# Patient Record
Sex: Male | Born: 1960 | Race: White | Hispanic: No | Marital: Married | Smoking: Former smoker
Health system: Southern US, Community
[De-identification: ages and names within clinical notes are randomized; demographics above are authoritative.]

## PROBLEM LIST (undated history)

## (undated) DIAGNOSIS — E119 Type 2 diabetes mellitus without complications: Secondary | ICD-10-CM

## (undated) DIAGNOSIS — E785 Hyperlipidemia, unspecified: Secondary | ICD-10-CM

## (undated) DIAGNOSIS — I10 Essential (primary) hypertension: Secondary | ICD-10-CM

## (undated) HISTORY — PX: APPENDECTOMY: SHX54

---

## 2007-10-14 DIAGNOSIS — B079 Viral wart, unspecified: Secondary | ICD-10-CM | POA: Insufficient documentation

## 2007-10-14 DIAGNOSIS — G43909 Migraine, unspecified, not intractable, without status migrainosus: Secondary | ICD-10-CM | POA: Insufficient documentation

## 2007-10-14 DIAGNOSIS — L909 Atrophic disorder of skin, unspecified: Secondary | ICD-10-CM | POA: Insufficient documentation

## 2010-12-12 DIAGNOSIS — G609 Hereditary and idiopathic neuropathy, unspecified: Secondary | ICD-10-CM | POA: Insufficient documentation

## 2010-12-12 DIAGNOSIS — G4733 Obstructive sleep apnea (adult) (pediatric): Secondary | ICD-10-CM | POA: Insufficient documentation

## 2010-12-12 DIAGNOSIS — E119 Type 2 diabetes mellitus without complications: Secondary | ICD-10-CM | POA: Insufficient documentation

## 2010-12-12 DIAGNOSIS — I2510 Atherosclerotic heart disease of native coronary artery without angina pectoris: Secondary | ICD-10-CM | POA: Insufficient documentation

## 2010-12-12 DIAGNOSIS — E782 Mixed hyperlipidemia: Secondary | ICD-10-CM | POA: Insufficient documentation

## 2010-12-12 DIAGNOSIS — I10 Essential (primary) hypertension: Secondary | ICD-10-CM | POA: Diagnosis present

## 2011-06-13 DIAGNOSIS — M109 Gout, unspecified: Secondary | ICD-10-CM | POA: Insufficient documentation

## 2013-11-25 DIAGNOSIS — K7460 Unspecified cirrhosis of liver: Secondary | ICD-10-CM | POA: Insufficient documentation

## 2014-01-30 ENCOUNTER — Emergency Department
Admission: EM | Admit: 2014-01-30 | Discharge: 2014-01-30 | Disposition: A | Discharge status: Home / Self Care | Payer: BC Managed Care – PPO | Source: Home / Self Care | Attending: Family Medicine | Admitting: Family Medicine

## 2014-01-30 ENCOUNTER — Encounter: Payer: Self-pay | Admitting: Emergency Medicine

## 2014-01-30 DIAGNOSIS — L03119 Cellulitis of unspecified part of limb: Secondary | ICD-10-CM

## 2014-01-30 DIAGNOSIS — L02419 Cutaneous abscess of limb, unspecified: Secondary | ICD-10-CM

## 2014-01-30 DIAGNOSIS — L03116 Cellulitis of left lower limb: Secondary | ICD-10-CM

## 2014-01-30 HISTORY — DX: Type 2 diabetes mellitus without complications: E11.9

## 2014-01-30 HISTORY — DX: Essential (primary) hypertension: I10

## 2014-01-30 HISTORY — DX: Hyperlipidemia, unspecified: E78.5

## 2014-01-30 MED ORDER — DOXYCYCLINE HYCLATE 100 MG PO CAPS: 100.0000 mg | ORAL_CAPSULE | Freq: Two times a day (BID) | ORAL | Status: DC

## 2014-01-30 NOTE — Discharge Instructions (Signed)
Apply warm compress or hot water bottle several times daily.  While stools are loose, try a BRAT diet (Bananas, Rice, Applesauce, Toast) To decrease loose stools, mix one heaping tablespoon Citrucel (methylcellulose) in 8 oz water and drink one to three times daily.     Cellulitis Cellulitis is an infection of the skin and the tissue beneath it. The infected area is usually red and tender. Cellulitis occurs most often in the arms and lower legs.  CAUSES  Cellulitis is caused by bacteria that enter the skin through cracks or cuts in the skin. The most common types of bacteria that cause cellulitis are Staphylococcus and Streptococcus. SYMPTOMS   Redness and warmth.  Swelling.  Tenderness or pain.  Fever. DIAGNOSIS  Your caregiver can usually determine what is wrong based on a physical exam. Blood tests may also be done. TREATMENT  Treatment usually involves taking an antibiotic medicine. HOME CARE INSTRUCTIONS   Take your antibiotics as directed. Finish them even if you start to feel better.  Keep the infected arm or leg elevated to reduce swelling.  Apply a warm cloth to the affected area up to 4 times per day to relieve pain.  Only take over-the-counter or prescription medicines for pain, discomfort, or fever as directed by your caregiver.  Keep all follow-up appointments as directed by your caregiver. SEEK MEDICAL CARE IF:   You notice red streaks coming from the infected area.  Your red area gets larger or turns dark in color.  Your bone or joint underneath the infected area becomes painful after the skin has healed.  Your infection returns in the same area or another area.  You notice a swollen bump in the infected area.  You develop new symptoms. SEEK IMMEDIATE MEDICAL CARE IF:   You have a fever.  You feel very sleepy.  You develop vomiting or diarrhea.  You have a general ill feeling (malaise) with muscle aches and pains. MAKE SURE YOU:   Understand  these instructions.  Will watch your condition.  Will get help right away if you are not doing well or get worse. Document Released: 07/02/2005 Document Revised: 03/23/2012 Document Reviewed: 12/08/2011 [facility]ExitCare Patient Information 2014 [place].

## 2014-01-30 NOTE — ED Notes (Signed)
Pt c/o possible insect bite to his LT upper thigh x 2 days. He also c/o diarrhea with green stool x 2 days. Denies fever.

## 2014-01-30 NOTE — ED Provider Notes (Signed)
CSN: [id number]     Arrival date & time 01/30/14  [id number] History   First MD Initiated Contact with Patient 01/30/14 1913     Chief Complaint  Patient presents with  . Insect Bite  . Abscess      HPI Comments: Patient noticed a bump on his left upper thigh two days ago that he thought could be an insect bite.  The area has become more tender and red.  He denies fevers, chills, and sweats and feels well otherwise but developed diarrhea last night.  No abdominal pain.  No nausea/vomiting.  Patient is a 53 y.o. male presenting with rash. The history is provided by the patient.  Rash Pain location: left upper thigh. Pain quality: aching   Pain severity:  Mild Onset quality:  Gradual Duration:  2 days Timing:  Constant Progression:  Worsening Chronicity:  New Relieved by:  Nothing Ineffective treatments:  None tried Associated symptoms: diarrhea   Associated symptoms: no anorexia, no chest pain, no chills, no fatigue, no fever, no hematochezia, no nausea, no sore throat and no vomiting     Past Medical History  Diagnosis Date  . Diabetes mellitus without complication   . Hypertension   . Hyperlipidemia    Past Surgical History  Procedure Laterality Date  . Appendectomy     Family History  Problem Relation Age of Onset  . Diabetes Mother   . Heart failure Mother   . Heart failure Father    History  Substance Use Topics  . Smoking status: Former Games developermoker  . Smokeless tobacco: Not on file  . Alcohol Use: No    Review of Systems  Constitutional: Negative for fever, chills and fatigue.  HENT: Negative for sore throat.   Cardiovascular: Negative for chest pain.  Gastrointestinal: Positive for diarrhea. Negative for nausea, vomiting, hematochezia and anorexia.  Skin: Positive for rash.  All other systems reviewed and are negative.   Allergies  Review of patient's allergies indicates no known allergies.  Home Medications   Prior to Admission medications   Medication Sig  Start Date End Date Taking? Authorizing Provider  allopurinol (ZYLOPRIM) 100 MG tablet Take 100 mg by mouth daily.   Yes Historical Provider, MD  amitriptyline (ELAVIL) 150 MG tablet Take 150 mg by mouth at bedtime.   Yes Historical Provider, MD  atenolol (TENORMIN) 100 MG tablet Take 100 mg by mouth daily.   Yes Historical Provider, MD  citalopram (CELEXA) 40 MG tablet Take 40 mg by mouth daily.   Yes Historical Provider, MD  clonazePAM (KLONOPIN) 0.5 MG tablet Take 0.5 mg by mouth 2 (two) times daily as needed for anxiety.   Yes Historical Provider, MD  glimepiride (AMARYL) 2 MG tablet Take 2 mg by mouth daily with breakfast.   Yes Historical Provider, MD  metFORMIN (GLUCOPHAGE) 1000 MG tablet Take 1,000 mg by mouth 2 (two) times daily with a meal.   Yes Historical Provider, MD  pravastatin (PRAVACHOL) 40 MG tablet Take 40 mg by mouth daily.   Yes Historical Provider, MD   BP 166/87  Pulse 74  Temp(Src) 98.1 F (36.7 C) (Oral)  Resp 18  Ht 6' (1.829 m)  Wt 268 lb (121.564 kg)  BMI 36.34 kg/m2  SpO2 98% Physical Exam  Nursing note and vitals reviewed. Constitutional: He is oriented to person, place, and time. He appears well-developed and well-nourished. No distress.  Patient is obese (BMI 36.3)  HENT:  Head: Normocephalic.  Mouth/Throat: Oropharynx is clear and moist.  Eyes: Conjunctivae are normal. Pupils are equal, round, and reactive to light.  Neck: Neck supple.  Cardiovascular: Normal heart sounds.   Pulmonary/Chest: Breath sounds normal.  Abdominal: Bowel sounds are normal. There is no tenderness.  Lymphadenopathy:    He has no cervical adenopathy.  Neurological: He is alert and oriented to person, place, and time.  Skin: Skin is warm and dry. Rash noted. Rash is macular.     On patient's left upper medial thigh is an area of macular erythema 5cm by 9cm  as noted on diagram.  No swelling or induration; area is mildly tender to palpation     ED Course  Procedures   none          MDM   1. Cellulitis of left thigh    Begin doxycycline. Apply warm compress or hot water bottle several times daily.  While stools are loose, try a BRAT diet (Bananas, Rice, Applesauce, Toast) To decrease loose stools, mix one heaping tablespoon Citrucel (methylcellulose) in 8 oz water and drink one to three times daily. Followup with Family Doctor if not improved in about 3 days.    Lattie HawStephen A Beese, MD 02/07/14 [phone]

## 2014-06-07 ENCOUNTER — Emergency Department
Admission: EM | Admit: 2014-06-07 | Discharge: 2014-06-07 | Disposition: A | Discharge status: Home / Self Care | Payer: BC Managed Care – PPO | Source: Home / Self Care | Attending: Family Medicine | Admitting: Family Medicine

## 2014-06-07 ENCOUNTER — Encounter: Payer: Self-pay | Admitting: Emergency Medicine

## 2014-06-07 DIAGNOSIS — L03119 Cellulitis of unspecified part of limb: Secondary | ICD-10-CM

## 2014-06-07 DIAGNOSIS — L02419 Cutaneous abscess of limb, unspecified: Secondary | ICD-10-CM

## 2014-06-07 DIAGNOSIS — L03116 Cellulitis of left lower limb: Secondary | ICD-10-CM

## 2014-06-07 MED ORDER — MUPIROCIN 2 % EX OINT: 1.0000 "application " | TOPICAL_OINTMENT | Freq: Three times a day (TID) | CUTANEOUS | Status: DC

## 2014-06-07 MED ORDER — DOXYCYCLINE HYCLATE 100 MG PO CAPS: 100.0000 mg | ORAL_CAPSULE | Freq: Two times a day (BID) | ORAL | Status: DC

## 2014-06-07 NOTE — Discharge Instructions (Signed)
Change bandage and apply Mupirocin ointment three times daily.  Obtain below knee support hose (mild compression 8-15mm Hg) and wear daytime.  Elevate whenever possible.   Cellulitis Cellulitis is an infection of the skin and the tissue beneath it. The infected area is usually red and tender. Cellulitis occurs most often in the arms and lower legs.  CAUSES  Cellulitis is caused by bacteria that enter the skin through cracks or cuts in the skin. The most common types of bacteria that cause cellulitis are staphylococci and streptococci. SIGNS AND SYMPTOMS   Redness and warmth.  Swelling.  Tenderness or pain.  Fever. DIAGNOSIS  Your health care provider can usually determine what is wrong based on a physical exam. Blood tests may also be done. TREATMENT  Treatment usually involves taking an antibiotic medicine. HOME CARE INSTRUCTIONS   Take your antibiotic medicine as directed by your health care provider. Finish the antibiotic even if you start to feel better.  Keep the infected arm or leg elevated to reduce swelling.  Apply a warm cloth to the affected area up to 4 times per day to relieve pain.  Take medicines only as directed by your health care provider.  Keep all follow-up visits as directed by your health care provider. SEEK MEDICAL CARE IF:   You notice red streaks coming from the infected area.  Your red area gets larger or turns dark in color.  Your bone or joint underneath the infected area becomes painful after the skin has healed.  Your infection returns in the same area or another area.  You notice a swollen bump in the infected area.  You develop new symptoms.  You have a fever. SEEK IMMEDIATE MEDICAL CARE IF:   You feel very sleepy.  You develop vomiting or diarrhea.  You have a general ill feeling (malaise) with muscle aches and pains. MAKE SURE YOU:   Understand these instructions.  Will watch your condition.  Will get help right away if you  are not doing well or get worse. Document Released: 07/02/2005 Document Revised: 02/06/2014 Document Reviewed: 12/08/2011 [facility] Patient Information 2015 [place], [place]. This information is not intended to replace advice given to you by your health care provider. Make sure you discuss any questions you have with your health care provider.

## 2014-06-07 NOTE — ED Notes (Signed)
Large landscaping stone fell on left distal lower leg x 1 week ago.  Wound to medial aspect of lower leg with scabbing and redness surrounding wound.  No drainage seen.

## 2014-06-07 NOTE — ED Notes (Signed)
Wound assessment for Left lower leg. Scabbed area: Length: 1 cm.  Width: 3 cm.   Area surrounding scabbed area: 1 cm redness. No swelling. No drainage. Pedal pulse strong.

## 2014-06-07 NOTE — ED Provider Notes (Signed)
CSN: [id number]     Arrival date & time 06/07/14  1803 History   First MD Initiated Contact with Patient 06/07/14 1849     Chief Complaint  Patient presents with  . Wound Infection      HPI Comments: Patient was working with landscaping retaining wall blocks one week ago.  One block fell and scraped his left lower leg.  The abrasion seemed to be healing until 3 days ago when it became painful and developed surrounding redness.  There has been no drainage.  No fevers, chills, and sweats   Patient is a 53 y.o. male presenting with rash. The history is provided by the patient.  Rash Pain location: left lower leg. Pain quality: aching   Pain severity:  No pain Onset quality:  Gradual Duration:  1 week Timing:  Constant Progression:  Worsening Chronicity:  New Context: recent illness   Relieved by:  None tried Worsened by:  Nothing tried Ineffective treatments:  None tried Associated symptoms: no chills, no fatigue, no fever and no nausea     Past Medical History  Diagnosis Date  . Diabetes mellitus without complication   . Hypertension   . Hyperlipidemia    Past Surgical History  Procedure Laterality Date  . Appendectomy     Family History  Problem Relation Age of Onset  . Diabetes Mother   . Heart failure Mother   . Heart failure Father    History  Substance Use Topics  . Smoking status: Former Games developer  . Smokeless tobacco: Not on file  . Alcohol Use: No    Review of Systems  Constitutional: Negative for fever, chills and fatigue.  Gastrointestinal: Negative for nausea.  Skin: Positive for rash.  All other systems reviewed and are negative.   Allergies  Review of patient's allergies indicates no known allergies.  Home Medications   Prior to Admission medications   Medication Sig Start Date End Date Taking? Authorizing Provider  allopurinol (ZYLOPRIM) 100 MG tablet Take 100 mg by mouth daily.    Historical Provider, MD  amitriptyline (ELAVIL) 150 MG tablet  Take 150 mg by mouth at bedtime.    Historical Provider, MD  atenolol (TENORMIN) 100 MG tablet Take 100 mg by mouth daily.    Historical Provider, MD  citalopram (CELEXA) 40 MG tablet Take 40 mg by mouth daily.    Historical Provider, MD  clonazePAM (KLONOPIN) 0.5 MG tablet Take 0.5 mg by mouth 2 (two) times daily as needed for anxiety.    Historical Provider, MD  doxycycline (VIBRAMYCIN) 100 MG capsule Take 1 capsule (100 mg total) by mouth 2 (two) times daily. 06/07/14   Lattie Haw, MD  glimepiride (AMARYL) 2 MG tablet Take 2 mg by mouth daily with breakfast.    Historical Provider, MD  metFORMIN (GLUCOPHAGE) 1000 MG tablet Take 1,000 mg by mouth 2 (two) times daily with a meal.    Historical Provider, MD  mupirocin ointment (BACTROBAN) 2 % Apply 1 application topically 3 (three) times daily. 06/07/14   Lattie Haw, MD  pravastatin (PRAVACHOL) 40 MG tablet Take 40 mg by mouth daily.    Historical Provider, MD   BP 128/76  Pulse 69  Temp(Src) 98.6 F (37 C) (Oral)  Resp 16  Ht 6' (1.829 m)  Wt 251 lb (113.853 kg)  BMI 34.03 kg/m2  SpO2 99% Physical Exam  Nursing note and vitals reviewed. Constitutional: He is oriented to person, place, and time. He appears well-developed and well-nourished. No  distress.  Patient is obese (BMI 34.0)  HENT:  Head: Normocephalic.  Eyes: Conjunctivae are normal. Pupils are equal, round, and reactive to light.  Cardiovascular: Normal heart sounds.   Pulmonary/Chest: Breath sounds normal.  Musculoskeletal:       Left lower leg: He exhibits tenderness and edema. He exhibits no bony tenderness, no swelling and no laceration.       Legs: There is an abrasion left lower leg pre-tibial area above ankle approximately 1.5cm by 3.0cm with dry eschar present.  There is surrounding erythema as noted on diagram, with tenderness to palpation.  No induration or fluctuance.  There is trace edema of the lower leg.    Lymphadenopathy:    He has no cervical  adenopathy.  Neurological: He is alert and oriented to person, place, and time.  Skin: Skin is warm and dry.      ED Course  Procedures  none   MDM   1. Cellulitis of left lower leg; suspect staph    Begin doxycycline, and Bactroban ointment. Change bandage and apply Mupirocin ointment three times daily.  Obtain below knee support hose (mild compression 8-15mm Hg) and wear daytime.  Elevate whenever possible. Followup with Family Doctor if not improved in about 5 days.    Lattie Haw, MD 06/11/14 [phone]

## 2014-09-07 ENCOUNTER — Emergency Department
Admission: EM | Admit: 2014-09-07 | Discharge: 2014-09-07 | Disposition: A | Discharge status: Home / Self Care | Payer: BC Managed Care – PPO | Source: Home / Self Care | Attending: Emergency Medicine | Admitting: Emergency Medicine

## 2014-09-07 ENCOUNTER — Emergency Department (INDEPENDENT_AMBULATORY_CARE_PROVIDER_SITE_OTHER): Payer: BC Managed Care – PPO

## 2014-09-07 ENCOUNTER — Encounter: Payer: Self-pay | Admitting: *Deleted

## 2014-09-07 DIAGNOSIS — M25552 Pain in left hip: Secondary | ICD-10-CM

## 2014-09-07 DIAGNOSIS — R52 Pain, unspecified: Secondary | ICD-10-CM

## 2014-09-07 DIAGNOSIS — M47897 Other spondylosis, lumbosacral region: Secondary | ICD-10-CM

## 2014-09-07 DIAGNOSIS — M545 Low back pain, unspecified: Secondary | ICD-10-CM

## 2014-09-07 DIAGNOSIS — M5442 Lumbago with sciatica, left side: Secondary | ICD-10-CM

## 2014-09-07 MED ORDER — METHYLPREDNISOLONE ACETATE 40 MG/ML IJ SUSP
80.0000 mg | Freq: Once | INTRAMUSCULAR | Status: AC
  Administered 2014-09-07: 80 mg via INTRAMUSCULAR

## 2014-09-07 MED ORDER — ACETAMINOPHEN 325 MG PO TABS
650.0000 mg | ORAL_TABLET | Freq: Once | ORAL | Status: AC
  Administered 2014-09-07: 650 mg via ORAL

## 2014-09-07 MED ORDER — HYDROCODONE-ACETAMINOPHEN 5-325 MG PO TABS: 1.0000 | ORAL_TABLET | ORAL | Status: DC | PRN

## 2014-09-07 MED ORDER — PREDNISONE (PAK) 10 MG PO TABS: ORAL_TABLET | ORAL | Status: DC

## 2014-09-07 NOTE — ED Notes (Signed)
Left hip pain started after picking up truck door 3 days ago. Pain radiates to left foot.

## 2014-09-07 NOTE — ED Provider Notes (Signed)
CSN: [id number]     Arrival date & time 09/07/14  1803 History   First MD Initiated Contact with Patient 09/07/14 1810     Chief Complaint  Patient presents with  . Hip Pain    Patient is a 53 y.o. male presenting with back pain. The history is provided by the patient.  Back Pain Location:  Lumbar spine (lumbar spine and left hip) Quality:  Shooting Radiates to:  L foot Pain severity:  Severe Pain is:  Unable to specify Onset quality:  Gradual Duration:  3 days Timing:  Constant Progression:  Worsening Chronicity:  New Context comment:  Started after he did some lifting three days ago Worsened by:  Bending, movement and twisting Ineffective treatments:  Bed rest Associated symptoms: leg pain and numbness   Associated symptoms: no abdominal pain, no bladder incontinence, no bowel incontinence, no chest pain, no dysuria, no fever, no perianal numbness and no weakness   Risk factors comment:  Lumbar surgery many years ago and [place]  History of lumbar discectomy many years ago in [place]. After moving to [place], many years ago, he had a flareup of lumbar pain, he states treated medically in 2003 by Dr. Shella Spearing, and the pain resolved.  Past Medical History  Diagnosis Date  . Diabetes mellitus without complication   . Hypertension   . Hyperlipidemia    Past Surgical History  Procedure Laterality Date  . Appendectomy     Family History  Problem Relation Age of Onset  . Diabetes Mother   . Heart failure Mother   . Heart failure Father    History  Substance Use Topics  . Smoking status: Former Games developer  . Smokeless tobacco: Not on file  . Alcohol Use: No    Review of Systems  Constitutional: Negative for fever.  Cardiovascular: Negative for chest pain.  Gastrointestinal: Negative for abdominal pain and bowel incontinence.  Genitourinary: Negative for bladder incontinence and dysuria.  Musculoskeletal: Positive for back pain.       Positive for left hip pain    Neurological: Positive for numbness. Negative for weakness.  All other systems reviewed and are negative.   Allergies  Review of patient's allergies indicates no known allergies.  Home Medications   Prior to Admission medications   Medication Sig Start Date End Date Taking? Authorizing Provider  allopurinol (ZYLOPRIM) 100 MG tablet Take 100 mg by mouth daily.    Historical Provider, MD  amitriptyline (ELAVIL) 150 MG tablet Take 150 mg by mouth at bedtime.    Historical Provider, MD  atenolol (TENORMIN) 100 MG tablet Take 100 mg by mouth daily.    Historical Provider, MD  citalopram (CELEXA) 40 MG tablet Take 40 mg by mouth daily.    Historical Provider, MD  clonazePAM (KLONOPIN) 0.5 MG tablet Take 0.5 mg by mouth 2 (two) times daily as needed for anxiety.    Historical Provider, MD  glimepiride (AMARYL) 2 MG tablet Take 2 mg by mouth daily with breakfast.    Historical Provider, MD  HYDROcodone-acetaminophen (NORCO/VICODIN) 5-325 MG per tablet Take 1-2 tablets by mouth every 4 (four) hours as needed for severe pain. Take with food. 09/07/14   Lajean Manes, MD  metFORMIN (GLUCOPHAGE) 1000 MG tablet Take 1,000 mg by mouth 2 (two) times daily with a meal.    Historical Provider, MD  mupirocin ointment (BACTROBAN) 2 % Apply 1 application topically 3 (three) times daily. 06/07/14   Lattie Haw, MD  pravastatin (PRAVACHOL) 40 MG tablet  Take 40 mg by mouth daily.    Historical Provider, MD  predniSONE (STERAPRED UNI-PAK) 10 MG tablet Start taking Friday December 4th.  Take as directed for 6 days. 09/07/14   Lajean Manesavid Massey, MD   BP 161/93 mmHg  Pulse 73  Temp(Src) 98.2 F (36.8 C) (Oral)  Resp 16  Wt 264 lb (119.75 kg)  SpO2 100% Physical Exam  Constitutional: He is oriented to person, place, and time. He appears well-developed and well-nourished. No distress.  Very uncomfortable from lumbar and left hip pain. He splints himself to avoid movement, which exacerbates the pain.  HENT:  Head:  Normocephalic and atraumatic.  Eyes: Conjunctivae and EOM are normal. Pupils are equal, round, and reactive to light. No scleral icterus.  Neck: Normal range of motion.  Cardiovascular: Normal rate.   Pulmonary/Chest: Effort normal.  Abdominal: He exhibits no distension.  Musculoskeletal:       Lumbar back: He exhibits decreased range of motion, tenderness and bony tenderness. He exhibits no laceration and normal pulse.       Back:  Diffuse and point tenderness lumbar spine, left SI joint, left greater trochanter, and left sciatic notch.  Left straight leg raise test positive.  Motor and sensory lower extremities equally and intact. DTRs 1+ and equal lower extremities bilaterally  Neurological: He is alert and oriented to person, place, and time.  Skin: Skin is warm. No rash noted.  Psychiatric: He has a normal mood and affect.  Nursing note and vitals reviewed.   ED Course  Procedures (including critical care time) Labs Review Labs Reviewed - No data to display  Imaging Review Dg Lumbar Spine Complete  09/07/2014   CLINICAL DATA:  Low back pain radiating down left leg. Back injury while lifting up a car door. Initial encounter.  EXAM: LUMBAR SPINE - COMPLETE 4+ VIEW  COMPARISON:  None.  FINDINGS: There is no evidence of lumbar spine fracture. Alignment is normal. Intervertebral disc spaces are maintained.  Lumbar vertebral osteophyte formation seen without significant disc space narrowing. Mild to moderate degenerative disc disease seen bilaterally at L4-5 and L5-S1.  IMPRESSION: No acute findings.  Degenerative spondylosis, as described above.   Electronically Signed   By: Myles RosenthalJohn  Stahl M.D.   On: 09/07/2014 19:05   Dg Hip Complete Left  09/07/2014   CLINICAL DATA:  Left hip and back pain for 4 days.  Initial visit.  EXAM: LEFT HIP - COMPLETE 2+ VIEW  COMPARISON:  None.  FINDINGS: No acute fracture. No dislocation. Mild degenerative changes in the hip joints and lower lumbar spine.   IMPRESSION: No acute bony pathology.   Electronically Signed   By: Maryclare BeanArt  Hoss M.D.   On: 09/07/2014 19:05     MDM   1. Left-sided low back pain with left-sided sciatica   2. Pain   3. Hip pain, acute, left   4. Acute lumbar back pain    Treatment options discussed, as well as risks, benefits, alternatives. Patient voiced understanding and agreement with the following plans: Depo-Medrol 80 mgIM stat. He states he thinks he might be intolerant or allergic to Toradol, so were avoiding NSAIDs. He lists cyclobenzaprine as an allergy, so I'm avoiding muscle relaxant. Discharge Medication List as of 09/07/2014  8:12 PM    START taking these medications   Details  HYDROcodone-acetaminophen (NORCO/VICODIN) 5-325 MG per tablet Take 1-2 tablets by mouth every 4 (four) hours as needed for severe pain. Take with food., Starting 09/07/2014, Until Discontinued, Print  predniSONE (STERAPRED UNI-PAK) 10 MG tablet Start taking Friday December 4th.  Take as directed for 6 days., Print       Follow-up with ortho or neurosurgeon in 1-2 days, or ER sooner if red flag/symptoms become worse. Precautions discussed. Red flags discussed. Questions invited and answered. Patient voiced understanding and agreement.     Lajean Manesavid Massey, MD 09/09/14 1055

## 2015-03-16 DIAGNOSIS — G2581 Restless legs syndrome: Secondary | ICD-10-CM | POA: Insufficient documentation

## 2015-03-16 DIAGNOSIS — F32A Depression, unspecified: Secondary | ICD-10-CM | POA: Insufficient documentation

## 2015-03-16 DIAGNOSIS — F419 Anxiety disorder, unspecified: Secondary | ICD-10-CM | POA: Insufficient documentation

## 2015-09-06 DIAGNOSIS — D696 Thrombocytopenia, unspecified: Secondary | ICD-10-CM | POA: Insufficient documentation

## 2015-09-19 DIAGNOSIS — K2270 Barrett's esophagus without dysplasia: Secondary | ICD-10-CM | POA: Insufficient documentation

## 2015-12-07 DIAGNOSIS — N529 Male erectile dysfunction, unspecified: Secondary | ICD-10-CM | POA: Insufficient documentation

## 2016-03-28 DIAGNOSIS — G4700 Insomnia, unspecified: Secondary | ICD-10-CM | POA: Insufficient documentation

## 2017-06-18 ENCOUNTER — Emergency Department (HOSPITAL_COMMUNITY)
Admission: EM | Admit: 2017-06-18 | Discharge: 2017-06-18 | Disposition: A | Discharge status: Home / Self Care | Payer: Self-pay | Attending: Emergency Medicine | Admitting: Emergency Medicine

## 2017-06-18 ENCOUNTER — Emergency Department (HOSPITAL_COMMUNITY): Payer: Self-pay

## 2017-06-18 ENCOUNTER — Encounter (HOSPITAL_COMMUNITY): Payer: Self-pay

## 2017-06-18 DIAGNOSIS — Z7984 Long term (current) use of oral hypoglycemic drugs: Secondary | ICD-10-CM | POA: Insufficient documentation

## 2017-06-18 DIAGNOSIS — Z79899 Other long term (current) drug therapy: Secondary | ICD-10-CM | POA: Insufficient documentation

## 2017-06-18 DIAGNOSIS — I10 Essential (primary) hypertension: Secondary | ICD-10-CM | POA: Insufficient documentation

## 2017-06-18 DIAGNOSIS — E119 Type 2 diabetes mellitus without complications: Secondary | ICD-10-CM | POA: Insufficient documentation

## 2017-06-18 DIAGNOSIS — Z87891 Personal history of nicotine dependence: Secondary | ICD-10-CM | POA: Insufficient documentation

## 2017-06-18 DIAGNOSIS — N201 Calculus of ureter: Secondary | ICD-10-CM | POA: Insufficient documentation

## 2017-06-18 LAB — URINALYSIS, ROUTINE W REFLEX MICROSCOPIC
Bacteria, UA: NONE SEEN
Bilirubin Urine: NEGATIVE
Glucose, UA: >=500 mg/dL — AB
Ketones, ur: NEGATIVE mg/dL
Leukocytes, UA: NEGATIVE
NITRITE: NEGATIVE
PH: 6 (ref 5.0–8.0)
Protein, ur: NEGATIVE mg/dL
SPECIFIC GRAVITY, URINE: 1.026 (ref 1.005–1.030)
Squamous Epithelial / LPF: NONE SEEN

## 2017-06-18 LAB — CBC WITH DIFFERENTIAL/PLATELET
BASOS ABS: 0 10*3/uL (ref 0.0–0.1)
BASOS PCT: 1 %
EOS ABS: 0.3 10*3/uL (ref 0.0–0.7)
Eosinophils Relative: 5 %
HCT: 42.1 % (ref 39.0–52.0)
Hemoglobin: 14.9 g/dL (ref 13.0–17.0)
Lymphocytes Relative: 20 %
Lymphs Abs: 1.1 10*3/uL (ref 0.7–4.0)
MCH: 30.3 pg (ref 26.0–34.0)
MCHC: 35.4 g/dL (ref 30.0–36.0)
MCV: 85.6 fL (ref 78.0–100.0)
Monocytes Absolute: 0.6 10*3/uL (ref 0.1–1.0)
Monocytes Relative: 10 %
Neutro Abs: 3.6 10*3/uL (ref 1.7–7.7)
Neutrophils Relative %: 65 %
Platelets: 84 10*3/uL — ABNORMAL LOW (ref 150–400)
RBC: 4.92 MIL/uL (ref 4.22–5.81)
RDW: 13.1 % (ref 11.5–15.5)
WBC: 5.6 10*3/uL (ref 4.0–10.5)

## 2017-06-18 LAB — COMPREHENSIVE METABOLIC PANEL
ALBUMIN: 3.9 g/dL (ref 3.5–5.0)
ALT: 26 U/L (ref 17–63)
AST: 38 U/L (ref 15–41)
Alkaline Phosphatase: 88 U/L (ref 38–126)
Anion gap: 9 (ref 5–15)
BUN: 7 mg/dL (ref 6–20)
CO2: 21 mmol/L — ABNORMAL LOW (ref 22–32)
CREATININE: 0.8 mg/dL (ref 0.61–1.24)
Calcium: 9.1 mg/dL (ref 8.9–10.3)
Chloride: 104 mmol/L (ref 101–111)
GFR calc Af Amer: >60 mL/min (ref >60)
GFR calc non Af Amer: >60 mL/min (ref >60)
Glucose, Bld: 321 mg/dL — ABNORMAL HIGH (ref 65–99)
Potassium: 4.4 mmol/L (ref 3.5–5.1)
SODIUM: 134 mmol/L — AB (ref 135–145)
TOTAL PROTEIN: 7 g/dL (ref 6.5–8.1)
Total Bilirubin: 1.3 mg/dL — ABNORMAL HIGH (ref 0.3–1.2)

## 2017-06-18 MED ORDER — OXYCODONE-ACETAMINOPHEN 5-325 MG PO TABS: 1.0000 | ORAL_TABLET | Freq: Four times a day (QID) | ORAL | 0 refills | Status: DC | PRN

## 2017-06-18 MED ORDER — ONDANSETRON 4 MG PO TBDP
ORAL_TABLET | ORAL | Status: AC
  Filled 2017-06-18: qty 1

## 2017-06-18 MED ORDER — ONDANSETRON HCL 4 MG PO TABS: 4.0000 mg | ORAL_TABLET | Freq: Four times a day (QID) | ORAL | 0 refills | Status: DC | PRN

## 2017-06-18 MED ORDER — HYDROMORPHONE HCL 1 MG/ML IJ SOLN
1.0000 mg | Freq: Once | INTRAMUSCULAR | Status: AC
  Administered 2017-06-18: 1 mg via INTRAVENOUS
  Filled 2017-06-18: qty 1

## 2017-06-18 MED ORDER — TAMSULOSIN HCL 0.4 MG PO CAPS: 0.4000 mg | ORAL_CAPSULE | Freq: Every day | ORAL | 0 refills | Status: DC

## 2017-06-18 MED ORDER — KETOROLAC TROMETHAMINE 30 MG/ML IJ SOLN
30.0000 mg | Freq: Once | INTRAMUSCULAR | Status: AC
  Administered 2017-06-18: 30 mg via INTRAVENOUS
  Filled 2017-06-18: qty 1

## 2017-06-18 MED ORDER — ONDANSETRON 4 MG PO TBDP
4.0000 mg | ORAL_TABLET | Freq: Once | ORAL | Status: AC
  Administered 2017-06-18: 4 mg via ORAL

## 2017-06-18 NOTE — ED Notes (Signed)
MD at bedside. 

## 2017-06-18 NOTE — ED Triage Notes (Signed)
Patient complains of nausea and vomiting that started last night and now has left flank pain, patient reports that he thinks he has kidney stone, has had 1 previously.

## 2017-06-18 NOTE — ED Provider Notes (Signed)
[facility] DEPT Provider Note   CSN: [id number] Arrival date & time: 06/18/17  0805     History   Chief Complaint Chief Complaint  Patient presents with  . Flank Pain    HPI Eric Ortega is a 56 y.o. male.  HPI Patient presents with acute onset left flank pain starting yesterday evening. Associated with nausea and 3 episodes of vomiting. Initially flank pain did radiate to the abdomen. States the pain comes in waves. Has had some urine discoloration and hesitancy but denies any dysuria or gross blood. No fever or chills. no constipation or diarrhea. Past Medical History:  Diagnosis Date  . Diabetes mellitus without complication ([place])   . Hyperlipidemia   . Hypertension     There are no active problems to display for this patient.   Past Surgical History:  Procedure Laterality Date  . APPENDECTOMY         Home Medications    Prior to Admission medications   Medication Sig Start Date End Date Taking? Authorizing Provider  allopurinol (ZYLOPRIM) 100 MG tablet Take 100 mg by mouth daily.   Yes [provider]  amitriptyline (ELAVIL) 150 MG tablet Take 150 mg by mouth at bedtime.   Yes [provider]  atenolol (TENORMIN) 100 MG tablet Take 100 mg by mouth every evening.    Yes [provider]  Blood Glucose Monitoring Suppl (ONETOUCH VERIO) w/Device KIT 1 each by Does not apply route as needed. 04/03/14  Yes [provider]  citalopram (CELEXA) 40 MG tablet Take 40 mg by mouth daily.   Yes [provider]  glimepiride (AMARYL) 2 MG tablet Take 2 mg by mouth daily with breakfast.   Yes [provider]  ibuprofen (ADVIL,MOTRIN) 200 MG tablet Take 600 mg by mouth every 6 (six) hours as needed for headache or mild pain.   Yes [provider]  lisinopril (PRINIVIL,ZESTRIL) 10 MG tablet Take 10 mg by mouth daily. 12/02/16  Yes [provider]  metFORMIN (GLUCOPHAGE) 1000 MG tablet Take 1,000 mg by mouth 2  (two) times daily with a meal.   Yes [provider]  ONETOUCH DELICA LANCETS 58X MISC 1 each by Does not apply route as needed. 04/03/14  Yes [provider]  pravastatin (PRAVACHOL) 40 MG tablet Take 40 mg by mouth daily.   Yes [provider]  traZODone (DESYREL) 100 MG tablet Take 100 mg by mouth at bedtime. 07/04/16 07/04/17 Yes [provider]  clonazePAM (KLONOPIN) 0.5 MG tablet Take 0.5 mg by mouth 2 (two) times daily as needed for anxiety.    [provider]  mupirocin ointment (BACTROBAN) 2 % Apply 1 application topically 3 (three) times daily. 06/07/14   Kandra Nicolas, MD  ondansetron (ZOFRAN) 4 MG tablet Take 1 tablet (4 mg total) by mouth every 6 (six) hours as needed for nausea or vomiting. 06/18/17   Julianne Rice, MD  oxyCODONE-acetaminophen (PERCOCET) 5-325 MG tablet Take 1-2 tablets by mouth every 6 (six) hours as needed for severe pain. 06/18/17   Julianne Rice, MD  predniSONE (STERAPRED UNI-PAK) 10 MG tablet Start taking Friday December 4th.  Take as directed for 6 days. 09/07/14   Jacqulyn Cane, MD  tamsulosin (FLOMAX) 0.4 MG CAPS capsule Take 1 capsule (0.4 mg total) by mouth daily. 06/18/17   Julianne Rice, MD    Family History Family History  Problem Relation Age of Onset  . Diabetes Mother   . Heart failure Mother   .  Heart failure Father     Social History Social History  Substance Use Topics  . Smoking status: Former Research scientist (life sciences)  . Smokeless tobacco: Never Used  . Alcohol use No     Allergies   Patient has no known allergies.   Review of Systems Review of Systems  Constitutional: Positive for diaphoresis. Negative for chills and fever.  Respiratory: Negative for cough and shortness of breath.   Cardiovascular: Negative for chest pain, palpitations and leg swelling.  Gastrointestinal: Positive for abdominal pain, nausea and vomiting. Negative for constipation and diarrhea.  Genitourinary: Positive for flank  pain. Negative for difficulty urinating, dysuria, frequency, hematuria, scrotal swelling and testicular pain.  Musculoskeletal: Positive for back pain and myalgias. Negative for neck pain and neck stiffness.  Skin: Negative for rash and wound.  Neurological: Negative for dizziness, weakness, light-headedness, numbness and headaches.  All other systems reviewed and are negative.    Physical Exam Updated Vital Signs BP (!) 146/80   Pulse 78   Temp 98.3 F (36.8 C) (Oral)   Resp 18   SpO2 95%   Physical Exam  Constitutional: He is oriented to person, place, and time. He appears well-developed and well-nourished.  HENT:  Head: Normocephalic and atraumatic.  Mouth/Throat: Oropharynx is clear and moist.  Eyes: Pupils are equal, round, and reactive to light. EOM are normal.  Neck: Normal range of motion. Neck supple.  Cardiovascular: Normal rate and regular rhythm.  Exam reveals no gallop and no friction rub.   No murmur heard. Pulmonary/Chest: Effort normal and breath sounds normal. No respiratory distress. He has no wheezes. He has no rales. He exhibits no tenderness.  Abdominal: Soft. Bowel sounds are normal. There is no tenderness. There is no rebound and no guarding.  Musculoskeletal: Normal range of motion. He exhibits no edema or tenderness.  Left-sided CVA tenderness to percussion. No midline thoracic or lumbar tenderness. No lower extremity swelling or asymmetry. Distal pulses are 2+.  Neurological: He is alert and oriented to person, place, and time.  Moving all Extremities without focal deficit. Sensation fully intact.  Skin: Skin is warm and dry. Capillary refill takes less than 2 seconds. No rash noted. No erythema.  Psychiatric: He has a normal mood and affect. His behavior is normal.  Nursing note and vitals reviewed.    ED Treatments / Results  Labs (all labs ordered are listed, but only abnormal results are displayed) Labs Reviewed  URINALYSIS, ROUTINE W REFLEX  MICROSCOPIC - Abnormal; Notable for the following:       Result Value   Glucose, UA >=500 (*)    Hgb urine dipstick LARGE (*)    All other components within normal limits  COMPREHENSIVE METABOLIC PANEL - Abnormal; Notable for the following:    Sodium 134 (*)    CO2 21 (*)    Glucose, Bld 321 (*)    Total Bilirubin 1.3 (*)    All other components within normal limits  CBC WITH DIFFERENTIAL/PLATELET - Abnormal; Notable for the following:    Platelets 84 (*)    All other components within normal limits    EKG  EKG Interpretation None       Radiology Ct Renal Stone Study  Result Date: 06/18/2017 CLINICAL DATA:  Left flank pain with nausea and vomiting. Hematuria. EXAM: CT ABDOMEN AND PELVIS WITHOUT CONTRAST TECHNIQUE: Multidetector CT imaging of the abdomen and pelvis was performed following the standard protocol without oral or intravenous contrast material administration. COMPARISON:  None. FINDINGS: Lower chest:  Lung bases are clear. There are foci of coronary artery calcification. Hepatobiliary: Liver has a somewhat nodular contour, concerning for underlying cirrhosis. No focal liver lesions are appreciable on this noncontrast enhanced study. There are small gallstones in the gallbladder. Gallbladder wall is not appreciably thickened. There is no biliary duct dilatation. Pancreas: There is no pancreatic mass or inflammatory focus. Spleen: Spleen measures 17.5 x 9.6 x 15.6 cm with a measured splenic volume of 1,310 cubic cm. No focal splenic lesions are evident on this noncontrast enhanced study. Adrenals/Urinary Tract: Adrenals appear normal bilaterally. Kidneys bilaterally show no evident mass. There is slight hydronephrosis on the left. There is no appreciable hydronephrosis on the right. There is a nonobstructing 3 mm calculus in the lower pole of the left kidney. There is a 2 mm calculus in the distal left ureter near the ureterovesical junction. No other ureteral calculi identified.  Urinary bladder is midline with wall thickness within normal limits. Stomach/Bowel: There are multiple sigmoid diverticula without evident diverticulitis. There is no appreciable bowel wall or mesenteric thickening. No bowel obstruction. No free air or portal venous air. Vascular/Lymphatic: There is atherosclerotic calcification in the aorta and common iliac arteries. No evident aneurysm. Major mesenteric vessels appear patent on this noncontrast enhanced study. There is no appreciable adenopathy in the abdomen or pelvis. There are a few subcentimeter lymph nodes in the right upper abdomen, likely presence secondary to be hepatic cirrhosis. Reproductive: Prostate and seminal vesicles appear normal in size and contour. There is no evident pelvic mass. Other: Appendix appears normal. There is no ascites or abscess in the abdomen or pelvis. Musculoskeletal: There is degenerative change in the lower thoracic and lumbar regions. There are no blastic or lytic bone lesions. There is no intramuscular or abdominal wall lesion. IMPRESSION: 1. 2 mm calculus distal left ureter causing mild left hydronephrosis and ureterectasis. 2.  3 mm nonobstructing calculus lower pole left kidney. 3. Evidence of hepatic cirrhosis. No focal liver lesions evident on this noncontrast enhanced study. There is splenomegaly without focal splenic lesion seen on this noncontrast enhanced study. 4.  Cholelithiasis. 5. No bowel obstruction. No abscess. Appendix appears normal. Multiple sigmoid diverticula noted. 6. Aortoiliac atherosclerosis. There are foci of coronary artery calcification. Aortic Atherosclerosis (ICD10-I70.0). Electronically Signed   By: Lowella Grip III M.D.   On: 06/18/2017 11:18    Procedures Procedures (including critical care time)  Medications Ordered in ED Medications  ondansetron (ZOFRAN-ODT) disintegrating tablet 4 mg (4 mg Oral Given 06/18/17 0812)  ketorolac (TORADOL) 30 MG/ML injection 30 mg (30 mg  Intravenous Given 06/18/17 1037)  HYDROmorphone (DILAUDID) injection 1 mg (1 mg Intravenous Given 06/18/17 1037)     Initial Impression / Assessment and Plan / ED Course  I have reviewed the triage vital signs and the nursing notes.  Pertinent labs & imaging results that were available during my care of the patient were reviewed by me and considered in my medical decision making (see chart for details).     Patient's pain is significantly improved. We'll treat symptomatically and have follow-up with urology. Return precautions given.  Final Clinical Impressions(s) / ED Diagnoses   Final diagnoses:  Ureterolithiasis    New Prescriptions New Prescriptions   ONDANSETRON (ZOFRAN) 4 MG TABLET    Take 1 tablet (4 mg total) by mouth every 6 (six) hours as needed for nausea or vomiting.   OXYCODONE-ACETAMINOPHEN (PERCOCET) 5-325 MG TABLET    Take 1-2 tablets by mouth every 6 (six) hours as needed  for severe pain.   TAMSULOSIN (FLOMAX) 0.4 MG CAPS CAPSULE    Take 1 capsule (0.4 mg total) by mouth daily.     Julianne Rice, MD 06/18/17 1256

## 2018-01-06 ENCOUNTER — Emergency Department (HOSPITAL_COMMUNITY)
Admission: EM | Admit: 2018-01-06 | Discharge: 2018-01-06 | Disposition: A | Discharge status: Home / Self Care | Payer: Self-pay | Attending: Emergency Medicine | Admitting: Emergency Medicine

## 2018-01-06 ENCOUNTER — Emergency Department (HOSPITAL_COMMUNITY): Payer: Self-pay

## 2018-01-06 ENCOUNTER — Other Ambulatory Visit: Payer: Self-pay

## 2018-01-06 ENCOUNTER — Encounter (HOSPITAL_COMMUNITY): Payer: Self-pay | Admitting: *Deleted

## 2018-01-06 DIAGNOSIS — Z79899 Other long term (current) drug therapy: Secondary | ICD-10-CM | POA: Insufficient documentation

## 2018-01-06 DIAGNOSIS — I10 Essential (primary) hypertension: Secondary | ICD-10-CM | POA: Insufficient documentation

## 2018-01-06 DIAGNOSIS — M79661 Pain in right lower leg: Secondary | ICD-10-CM | POA: Insufficient documentation

## 2018-01-06 DIAGNOSIS — E119 Type 2 diabetes mellitus without complications: Secondary | ICD-10-CM | POA: Insufficient documentation

## 2018-01-06 DIAGNOSIS — Z87891 Personal history of nicotine dependence: Secondary | ICD-10-CM | POA: Insufficient documentation

## 2018-01-06 DIAGNOSIS — T391X1A Poisoning by 4-Aminophenol derivatives, accidental (unintentional), initial encounter: Secondary | ICD-10-CM | POA: Insufficient documentation

## 2018-01-06 DIAGNOSIS — Z7984 Long term (current) use of oral hypoglycemic drugs: Secondary | ICD-10-CM | POA: Insufficient documentation

## 2018-01-06 DIAGNOSIS — M5441 Lumbago with sciatica, right side: Secondary | ICD-10-CM | POA: Insufficient documentation

## 2018-01-06 LAB — CBC WITH DIFFERENTIAL/PLATELET
Basophils Absolute: 0 10*3/uL (ref 0.0–0.1)
Basophils Relative: 1 %
EOS PCT: 7 %
Eosinophils Absolute: 0.4 10*3/uL (ref 0.0–0.7)
HEMATOCRIT: 47.1 % (ref 39.0–52.0)
HEMOGLOBIN: 16.4 g/dL (ref 13.0–17.0)
LYMPHS ABS: 1.9 10*3/uL (ref 0.7–4.0)
Lymphocytes Relative: 35 %
MCH: 30.3 pg (ref 26.0–34.0)
MCHC: 34.8 g/dL (ref 30.0–36.0)
MCV: 87.1 fL (ref 78.0–100.0)
Monocytes Absolute: 0.5 10*3/uL (ref 0.1–1.0)
Monocytes Relative: 8 %
NEUTROS ABS: 2.7 10*3/uL (ref 1.7–7.7)
Neutrophils Relative %: 49 %
PLATELETS: 82 10*3/uL — AB (ref 150–400)
RBC: 5.41 MIL/uL (ref 4.22–5.81)
RDW: 13.9 % (ref 11.5–15.5)
WBC: 5.4 10*3/uL (ref 4.0–10.5)

## 2018-01-06 LAB — PROTIME-INR
INR: 1.13
PROTHROMBIN TIME: 14.4 s (ref 11.4–15.2)

## 2018-01-06 LAB — COMPREHENSIVE METABOLIC PANEL
ALT: 28 U/L (ref 17–63)
ANION GAP: 10 (ref 5–15)
AST: 35 U/L (ref 15–41)
Albumin: 3.7 g/dL (ref 3.5–5.0)
Alkaline Phosphatase: 114 U/L (ref 38–126)
BUN: 9 mg/dL (ref 6–20)
CO2: 21 mmol/L — ABNORMAL LOW (ref 22–32)
Calcium: 9.1 mg/dL (ref 8.9–10.3)
Chloride: 103 mmol/L (ref 101–111)
Creatinine, Ser: 0.72 mg/dL (ref 0.61–1.24)
GFR calc Af Amer: >60 mL/min (ref >60)
Glucose, Bld: 167 mg/dL — ABNORMAL HIGH (ref 65–99)
Potassium: 4.4 mmol/L (ref 3.5–5.1)
Sodium: 134 mmol/L — ABNORMAL LOW (ref 135–145)
Total Bilirubin: 1.3 mg/dL — ABNORMAL HIGH (ref 0.3–1.2)
Total Protein: 6.9 g/dL (ref 6.5–8.1)

## 2018-01-06 LAB — URINALYSIS, ROUTINE W REFLEX MICROSCOPIC
Bilirubin Urine: NEGATIVE
GLUCOSE, UA: 50 mg/dL — AB
Hgb urine dipstick: NEGATIVE
Ketones, ur: NEGATIVE mg/dL
LEUKOCYTES UA: NEGATIVE
Nitrite: NEGATIVE
Protein, ur: NEGATIVE mg/dL
SPECIFIC GRAVITY, URINE: 1.018 (ref 1.005–1.030)
pH: 5 (ref 5.0–8.0)

## 2018-01-06 LAB — ACETAMINOPHEN LEVEL: Acetaminophen (Tylenol), Serum: <10 ug/mL — ABNORMAL LOW (ref 10–30)

## 2018-01-06 LAB — APTT: APTT: 30 s (ref 24–36)

## 2018-01-06 LAB — SALICYLATE LEVEL: Salicylate Lvl: <7 mg/dL (ref 2.8–30.0)

## 2018-01-06 MED ORDER — MELOXICAM 7.5 MG PO TABS: 7.5000 mg | ORAL_TABLET | Freq: Every day | ORAL | 0 refills | Status: DC

## 2018-01-06 MED ORDER — LORAZEPAM 2 MG/ML IJ SOLN
1.0000 mg | INTRAMUSCULAR | Status: AC
  Administered 2018-01-06: 1 mg via INTRAVENOUS
  Filled 2018-01-06: qty 1

## 2018-01-06 MED ORDER — KETOROLAC TROMETHAMINE 30 MG/ML IJ SOLN
15.0000 mg | Freq: Once | INTRAMUSCULAR | Status: AC
  Administered 2018-01-06: 15 mg via INTRAVENOUS
  Filled 2018-01-06: qty 1

## 2018-01-06 MED ORDER — METHOCARBAMOL 750 MG PO TABS: 750.0000 mg | ORAL_TABLET | Freq: Three times a day (TID) | ORAL | 0 refills | Status: DC | PRN

## 2018-01-06 NOTE — ED Triage Notes (Signed)
Pt in c/o right lower back pain that started when getting out of his car yesterday, worse with movement

## 2018-01-06 NOTE — Discharge Instructions (Addendum)
Please do not take any Tylenol or consume any alcohol for the next week.  Please do not take ibuprofen, Motrin, Aleve, or naproxen while taking the Mobic also known as meloxicam.  I have given you a prescription for Mobic (meloxicam) today.  Mobic is a NSAID medication and you should not take it with other NSAIDs.  Examples of other NSAIDS include motrin, ibuprofen, aleve, naproxen, and Voltaren.  Please monitor your bowel movements for dark, tarry, sticky stools. If you have any bowel movements like this you need to stop taking mobic and call your doctor as this may represent a stomach ulcer from taking NSAIDS.    PLEASE DO NOT TAKE THESE MEDICATIONS, THIS IS A GUIDE ON THE MAXIMUM SAFE DOSES.  Please take Ibuprofen (Advil, motrin) and Tylenol (acetaminophen) to relieve your pain.  You may take up to 600 MG (3 pills) of normal strength ibuprofen every 8 hours as needed.  In between doses of ibuprofen you make take tylenol, up to 1,000 mg (two extra strength pills).  Do not take more than 3,000 mg tylenol in a 24 hour period.  Please check all medication labels as many medications such as pain and cold medications may contain tylenol.  Do not drink alcohol while taking these medications.  Do not take other NSAID'S while taking ibuprofen (such as aleve or naproxen).  Please take ibuprofen with food to decrease stomach upset.   Today you received medications that may make you sleepy or impair your ability to make decisions.  For the next 24 hours please do not drive, operate heavy machinery, care for a small child with out another adult present, or perform any activities that may cause harm to you or someone else if you were to fall asleep or be impaired.   You are being prescribed a medication which may make you sleepy. Please follow up of listed precautions for at least 24 hours after taking one dose.

## 2018-01-06 NOTE — ED Notes (Signed)
Patient transported to MRI 

## 2018-01-06 NOTE — ED Notes (Signed)
Pt returned from MRI, nad 

## 2018-01-06 NOTE — ED Provider Notes (Signed)
[facility] EMERGENCY DEPARTMENT Provider Note   CSN: [id number] Arrival date & time: 01/06/18  1039     History   Chief Complaint Chief Complaint  Patient presents with  . Back Pain    HPI Eric Ortega is a 57 y.o. male with a history of diabetes, hypertension, hyperlipidemia, lumbar discectomy who presents today for evaluation of back pain.  He reports that last night he was getting out of his car when he felt his back pop.  Reports that since the weakness and numbness in his right leg.  He reports that this is new.  He denies any other trauma.  No personal history of cancer or IV drug use.  He reports his discectomy was many years ago in [place].  He reports that last night around 9pm he took about 5 pills of the extra strength 516m tylenol pills and then again 5 pills at 4am giving him a total of 5g of tylenol in a 6 hour period.  He denies any history of liver failure.  He denies any alcohol use.  No burning or stinging when he pees.     HPI  Past Medical History:  Diagnosis Date  . [place]   . Hyperlipidemia   . Hypertension     There are no active problems to display for this patient.   Past Surgical History:  Procedure Laterality Date  . APPENDECTOMY          Home Medications    Prior to Admission medications   Medication Sig Start Date End Date Taking? Authorizing Provider  allopurinol (ZYLOPRIM) 100 MG tablet Take 100 mg by mouth daily.    [provider]  amitriptyline (ELAVIL) 150 MG tablet Take 150 mg by mouth at bedtime.    [provider]  atenolol (TENORMIN) 100 MG tablet Take 100 mg by mouth every evening.     [provider]  Blood Glucose Monitoring Suppl (ONETOUCH VERIO) w/Device KIT 1 each by Does not apply route as needed. 04/03/14   [provider]  citalopram (CELEXA) 40 MG tablet Take 40 mg by mouth daily.    [provider]  clonazePAM (KLONOPIN)  0.5 MG tablet Take 0.5 mg by mouth 2 (two) times daily as needed for anxiety.    [provider]  glimepiride (AMARYL) 2 MG tablet Take 2 mg by mouth daily with breakfast.    [provider]  ibuprofen (ADVIL,MOTRIN) 200 MG tablet Take 600 mg by mouth every 6 (six) hours as needed for headache or mild pain.    [provider]  lisinopril (PRINIVIL,ZESTRIL) 10 MG tablet Take 10 mg by mouth daily. 12/02/16   [provider]  meloxicam (MOBIC) 7.5 MG tablet Take 1 tablet (7.5 mg total) by mouth daily. 01/06/18   HLorin Glass PA-C  metFORMIN (GLUCOPHAGE) 1000 MG tablet Take 1,000 mg by mouth 2 (two) times daily with a meal.    [provider]  methocarbamol (ROBAXIN) 750 MG tablet Take 1-2 tablets (750-1,500 mg total) by mouth 3 (three) times daily as needed for muscle spasms. 01/06/18   HLorin Glass PA-C  mupirocin ointment (BACTROBAN) 2 % Apply 1 application topically 3 (three) times daily. 06/07/14   BKandra Nicolas MD  ondansetron (ZOFRAN) 4 MG tablet Take 1 tablet (4 mg total) by mouth every 6 (six) hours as needed for nausea or vomiting. 06/18/17   YJulianne Rice MD  OMdsine LLCDELICA LANCETS 325QMISC 1  each by Does not apply route as needed. 04/03/14   [provider]  oxyCODONE-acetaminophen (PERCOCET) 5-325 MG tablet Take 1-2 tablets by mouth every 6 (six) hours as needed for severe pain. 06/18/17   Julianne Rice, MD  pravastatin (PRAVACHOL) 40 MG tablet Take 40 mg by mouth daily.    [provider]  predniSONE (STERAPRED UNI-PAK) 10 MG tablet Start taking Friday December 4th.  Take as directed for 6 days. 09/07/14   Jacqulyn Cane, MD  tamsulosin (FLOMAX) 0.4 MG CAPS capsule Take 1 capsule (0.4 mg total) by mouth daily. 06/18/17   Julianne Rice, MD  traZODone (DESYREL) 100 MG tablet Take 100 mg by mouth at bedtime. 07/04/16 07/04/17  [provider]    Family History Family History  Problem Relation Age of  Onset  . Diabetes Mother   . Heart failure Mother   . Heart failure Father     Social History Social History   Tobacco Use  . Smoking status: Former Research scientist (life sciences)  . Smokeless tobacco: Never Used  Substance Use Topics  . Alcohol use: No  . Drug use: No     Allergies   Patient has no known allergies.   Review of Systems Review of Systems  Constitutional: Negative for chills and fever.  HENT: Negative for ear pain and sore throat.   Eyes: Negative for pain and visual disturbance.  Respiratory: Negative for cough and shortness of breath.   Cardiovascular: Negative for chest pain and palpitations.  Gastrointestinal: Negative for abdominal pain and vomiting.  Genitourinary: Negative for dysuria and hematuria.  Musculoskeletal: Positive for back pain. Negative for arthralgias.  Skin: Negative for color change and rash.  Neurological: Positive for weakness (right leg) and numbness (Decreased sensation in right leg. ). Negative for seizures, syncope and headaches.  All other systems reviewed and are negative.    Physical Exam Updated Vital Signs BP (!) 153/80 (BP Location: Left Arm)   Pulse 65   Temp 97.8 F (36.6 C) (Oral)   Resp 16   SpO2 98%   Physical Exam  Constitutional: He appears well-developed and well-nourished. No distress.  HENT:  Head: Normocephalic and atraumatic.  Eyes: Conjunctivae are normal. Right eye exhibits no discharge. Left eye exhibits no discharge. No scleral icterus.  Neck: Normal range of motion.  Cardiovascular: Normal rate, regular rhythm and intact distal pulses.  2+ DP/PT pulses bilaterally  Pulmonary/Chest: Effort normal and breath sounds normal. No stridor. No respiratory distress.  Abdominal: Soft. Bowel sounds are normal. He exhibits no distension. There is no tenderness.  Musculoskeletal: He exhibits no edema or deformity.  5/5 strength in left leg, 4/5 strength in right leg through ankle dorsiflexion and plantar flexion, knee and hip  flexion and extension.    Neurological: He is alert. He exhibits normal muscle tone.  Subjective decreased sensation of foot and right lateral leg.  Skin: Skin is warm and dry. He is not diaphoretic.  Psychiatric: He has a normal mood and affect. His behavior is normal.  Nursing note and vitals reviewed.    ED Treatments / Results  Labs (all labs ordered are listed, but only abnormal results are displayed) Labs Reviewed  COMPREHENSIVE METABOLIC PANEL - Abnormal; Notable for the following components:      Result Value   Sodium 134 (*)    CO2 21 (*)    Glucose, Bld 167 (*)    Total Bilirubin 1.3 (*)    All other components within normal limits  CBC WITH DIFFERENTIAL/PLATELET -  Abnormal; Notable for the following components:   Platelets 82 (*)    All other components within normal limits  ACETAMINOPHEN LEVEL - Abnormal; Notable for the following components:   Acetaminophen (Tylenol), Serum <10 (*)    All other components within normal limits  URINALYSIS, ROUTINE W REFLEX MICROSCOPIC - Abnormal; Notable for the following components:   Glucose, UA 50 (*)    All other components within normal limits  PROTIME-INR  SALICYLATE LEVEL  APTT    EKG None  Radiology Mr Lumbar Spine Wo Contrast  Result Date: 01/06/2018 CLINICAL DATA:  57 y/o M; right lower back pain that started when getting out of his car yesterday, worse with movement. EXAM: MRI LUMBAR SPINE WITHOUT CONTRAST TECHNIQUE: Multiplanar, multisequence MR imaging of the lumbar spine was performed. No intravenous contrast was administered. COMPARISON:  06/18/2017 CT abdomen and pelvis FINDINGS: Segmentation:  Standard. Alignment:  Physiologic. Vertebrae: No fracture, evidence of discitis, or bone lesion. L3-S1 fluid between spinous processes, compatible with Baastrup's disease. Conus medullaris and cauda equina: Conus extends to the L3-4 level. Fatty filum. No posterior dysraphism identified. Paraspinal and other soft tissues:  Negative. Disc levels: L1-2: Small left foraminal protrusion with mild left foraminal stenosis. No canal stenosis. L2-3: Minimal disc bulge. No significant foraminal or canal stenosis. L3-4: Small right foraminal disc protrusion with mild right foraminal stenosis. Mild facet hypertrophy. No significant canal or left foraminal stenosis. L4-5: Diffuse disc bulge with right subarticular annular fissure. Moderate facet hypertrophy. Mild foraminal and canal stenosis. L5-S1: Disc bulge with endplate marginal osteophytes, moderate facet, and ligamentum flavum hypertrophy. Moderate bilateral foraminal stenosis. No canal stenosis. IMPRESSION: 1. Low-lying conus and fatty filum.  No posterior dysraphism. 2. Lumbar spondylosis greatest at the L4-5 and L5-S1 levels. 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 3. L4-5 mild canal stenosis. 4. Moderate L5-S1 foraminal stenosis. Multilevel mild foraminal stenosis. Electronically Signed   By: Kristine Garbe M.D.   On: 01/06/2018 15:31    Procedures Procedures (including critical care time)  Medications Ordered in ED Medications  LORazepam (ATIVAN) injection 1 mg (1 mg Intravenous Given 01/06/18 1356)  ketorolac (TORADOL) 30 MG/ML injection 15 mg (15 mg Intravenous Given 01/06/18 1557)     Initial Impression / Assessment and Plan / ED Course  I have reviewed the triage vital signs and the nursing notes.  Pertinent labs & imaging results that were available during my care of the patient were reviewed by me and considered in my medical decision making (see chart for details).  Clinical Course as of Jan 08 1415  Wed Jan 06, 2018  1556 Patient updated on results, need to refrain from Tylenol.   [EH]    Clinical Course User Index [EH] Lorin Glass, PA-C   Patient with back pain.  Left leg weakness and abnormal neuro exam.  Based on history MRI was obtained to evaluate for cauda equina or other emergent causes of his symptoms.   MRI obtained and reviewed with out cause for his symptoms found.  His neuro exam and pain both improved after being given ativan for MRI anxiety and at the time of discharge he had a normal neuro exam and was able to ambulate with out difficulty in the dept.   No fever, night sweats, weight loss, h/o cancer, IVDU.  RICE protocol and pain medicine indicated and discussed with patient.  Improvement after ativan consistent with muscle spasm related cause of his symptoms.  He will be given rx for mobic for  pain and robaxin for his muscle spasms.  Strict return precautions discussed and stated his understanding.    Regarding tylenol use he was advised to not use any tylenol or alcohol for at least one week.  His labs were obtained and reviewed, thrombocytopenia and mild elevation of bilirubin consistent with his baseline labs, PCP follow up recommended.  Educated on future safe use of tylenol.    Final Clinical Impressions(s) / ED Diagnoses   Final diagnoses:  Acute bilateral low back pain with right-sided sciatica  Accidental acetaminophen overdose, initial encounter    ED Discharge Orders        Ordered    meloxicam (MOBIC) 7.5 MG tablet  Daily     01/06/18 1652    methocarbamol (ROBAXIN) 750 MG tablet  3 times daily PRN     01/06/18 1652       Lorin Glass, PA-C 01/07/18 1417    Pixie Casino, MD 01/09/18 [phone]

## 2019-09-21 ENCOUNTER — Emergency Department (HOSPITAL_COMMUNITY)
Admission: EM | Admit: 2019-09-21 | Discharge: 2019-09-21 | Disposition: A | Discharge status: Home / Self Care | Payer: Self-pay | Attending: Emergency Medicine | Admitting: Emergency Medicine

## 2019-09-21 ENCOUNTER — Other Ambulatory Visit: Payer: Self-pay

## 2019-09-21 ENCOUNTER — Emergency Department (HOSPITAL_COMMUNITY): Payer: Self-pay

## 2019-09-21 ENCOUNTER — Encounter (HOSPITAL_COMMUNITY): Payer: Self-pay

## 2019-09-21 DIAGNOSIS — Y999 Unspecified external cause status: Secondary | ICD-10-CM | POA: Insufficient documentation

## 2019-09-21 DIAGNOSIS — Z79899 Other long term (current) drug therapy: Secondary | ICD-10-CM | POA: Insufficient documentation

## 2019-09-21 DIAGNOSIS — W19XXXA Unspecified fall, initial encounter: Secondary | ICD-10-CM

## 2019-09-21 DIAGNOSIS — S060X1A Concussion with loss of consciousness of 30 minutes or less, initial encounter: Secondary | ICD-10-CM | POA: Insufficient documentation

## 2019-09-21 DIAGNOSIS — I10 Essential (primary) hypertension: Secondary | ICD-10-CM | POA: Insufficient documentation

## 2019-09-21 DIAGNOSIS — M549 Dorsalgia, unspecified: Secondary | ICD-10-CM | POA: Insufficient documentation

## 2019-09-21 DIAGNOSIS — Y939 Activity, unspecified: Secondary | ICD-10-CM | POA: Insufficient documentation

## 2019-09-21 DIAGNOSIS — Y92009 Unspecified place in unspecified non-institutional (private) residence as the place of occurrence of the external cause: Secondary | ICD-10-CM

## 2019-09-21 DIAGNOSIS — Z7984 Long term (current) use of oral hypoglycemic drugs: Secondary | ICD-10-CM | POA: Insufficient documentation

## 2019-09-21 DIAGNOSIS — E119 Type 2 diabetes mellitus without complications: Secondary | ICD-10-CM | POA: Insufficient documentation

## 2019-09-21 DIAGNOSIS — W010XXA Fall on same level from slipping, tripping and stumbling without subsequent striking against object, initial encounter: Secondary | ICD-10-CM | POA: Insufficient documentation

## 2019-09-21 DIAGNOSIS — Y929 Unspecified place or not applicable: Secondary | ICD-10-CM | POA: Insufficient documentation

## 2019-09-21 MED ORDER — HYDROCODONE-ACETAMINOPHEN 5-325 MG PO TABS: 1.0000 | ORAL_TABLET | Freq: Four times a day (QID) | ORAL | 0 refills | Status: DC | PRN

## 2019-09-21 MED ORDER — OXYCODONE-ACETAMINOPHEN 5-325 MG PO TABS
1.0000 | ORAL_TABLET | Freq: Once | ORAL | Status: AC
  Administered 2019-09-21: 14:00:00 1 via ORAL
  Filled 2019-09-21: qty 1

## 2019-09-21 MED ORDER — ONDANSETRON 4 MG PO TBDP
4.0000 mg | ORAL_TABLET | Freq: Once | ORAL | Status: AC
  Administered 2019-09-21: 12:00:00 4 mg via ORAL
  Filled 2019-09-21: qty 1

## 2019-09-21 MED ORDER — CYCLOBENZAPRINE HCL 10 MG PO TABS: 10.0000 mg | ORAL_TABLET | Freq: Two times a day (BID) | ORAL | 0 refills | Status: DC | PRN

## 2019-09-21 MED ORDER — KETOROLAC TROMETHAMINE 30 MG/ML IJ SOLN
30.0000 mg | Freq: Once | INTRAMUSCULAR | Status: AC
Start: 2019-09-21 — End: 2019-09-21
  Administered 2019-09-21: 14:00:00 30 mg via INTRAMUSCULAR
  Filled 2019-09-21: qty 1

## 2019-09-21 MED ORDER — OXYCODONE-ACETAMINOPHEN 5-325 MG PO TABS
1.0000 | ORAL_TABLET | Freq: Once | ORAL | Status: AC
  Administered 2019-09-21: 1 via ORAL
  Filled 2019-09-21: qty 1

## 2019-09-21 MED ORDER — METHOCARBAMOL 500 MG PO TABS
500.0000 mg | ORAL_TABLET | Freq: Once | ORAL | Status: AC
  Administered 2019-09-21: 14:00:00 500 mg via ORAL
  Filled 2019-09-21: qty 1

## 2019-09-21 NOTE — ED Triage Notes (Addendum)
Patient reports that he slipped on ice this am and fell backwards hitting back and head, no loc, no bleed thinners. Complains of spinal, neck and head pain. c-collar applied at triage

## 2019-09-21 NOTE — Discharge Instructions (Addendum)
Please read instructions below. You can treat your headache with over-the-counter medications such as tylenol as needed. Stay hydrated and get plenty of rest. Limit your screen time and complex thinking. Avoid any contact sports/activities to prevent re-injury to your head. Follow up with your primary care provider in 1 week for re-check and to be cleared to return to normal activity. You can take 600 mg of ibuprofen every 6 hours as needed for back pain.   You can take Flexeril/cyclobenzaprine every 12 hours as needed for muscle spasm.  Be aware this medication can make you drowsy; do not take while driving or drinking alcohol.   Apply ice to your back for 20 minutes at a time.  You can also apply heat if this provides more relief.   Return to the ER if you develop severely worsening headache, changes in your vision, persistent vomiting,  new numbness or tingling in your arms or legs, inability to urinate, inability to hold your bowels, or weakness in your extremitiesor new or concerning symptoms.

## 2019-09-21 NOTE — ED Notes (Signed)
Patient Alert and oriented to baseline. Stable and ambulatory to baseline. Patient verbalized understanding of the discharge instructions.  Patient belongings were taken by the patient.   

## 2019-09-21 NOTE — ED Notes (Signed)
Walked patient down the hall patient did well just stated that he had a little pain patient is now back in bed

## 2019-09-21 NOTE — ED Notes (Signed)
Hooked patient up to the monitor patient is resting with call bell in reach 

## 2019-09-21 NOTE — ED Provider Notes (Signed)
[name] Provider Note   CSN: [id number] Arrival date & time: 09/21/19  1006     History Chief Complaint  Patient presents with  . Fall    Eric Ortega is a 58 y.o. male with past medical history of hypertension, hyperlipidemia, diabetes, presenting the emergency department after mechanical fall that occurred prior to arrival.  Patient reports he slipped on the ice and his feet came out from under him, he fell onto his back and hit his head on the ground.  He states he had brief LOC that lasted a few seconds in duration.  He is complaining of generalized headache, neck pain, and pain that begins between his shoulder blades and goes down through his low back.  Pain is worse with movement.  He reports a mild nausea.  He denies vision changes, chest pain, abdominal pain, pain is extremities, numbness or weakness in his extremities, saddle paresthesia, bowel or bladder incontinence.  Reports remote history of discectomy in his lumbar spine.  No anticoagulation.  The history is provided by the patient.       Past Medical History:  Diagnosis Date  . Diabetes mellitus without complication ([place])   . Hyperlipidemia   . Hypertension     There are no problems to display for this patient.   Past Surgical History:  Procedure Laterality Date  . APPENDECTOMY         Family History  Problem Relation Age of Onset  . Diabetes Mother   . Heart failure Mother   . Heart failure Father     Social History   Tobacco Use  . Smoking status: Former Research scientist (life sciences)  . Smokeless tobacco: Never Used  Substance Use Topics  . Alcohol use: No  . Drug use: No    Home Medications Prior to Admission medications   Medication Sig Start Date End Date Taking? Authorizing Provider  allopurinol (ZYLOPRIM) 100 MG tablet Take 100 mg by mouth daily.    [provider]  amitriptyline (ELAVIL) 150 MG tablet Take 150 mg by mouth at bedtime.    [provider]  atenolol (TENORMIN) 100 MG tablet Take 100 mg by mouth every evening.     [provider]  Blood Glucose Monitoring Suppl (ONETOUCH VERIO) w/Device KIT 1 each by Does not apply route as needed. 04/03/14   [provider]  citalopram (CELEXA) 40 MG tablet Take 40 mg by mouth daily.    [provider]  clonazePAM (KLONOPIN) 0.5 MG tablet Take 0.5 mg by mouth 2 (two) times daily as needed for anxiety.    [provider]  cyclobenzaprine (FLEXERIL) 10 MG tablet Take 1 tablet (10 mg total) by mouth 2 (two) times daily as needed for muscle spasms. 09/21/19   Eric Ortega, [place] N, PA-C  glimepiride (AMARYL) 2 MG tablet Take 2 mg by mouth daily with breakfast.    [provider]  HYDROcodone-acetaminophen (NORCO/VICODIN) 5-325 MG tablet Take 1 tablet by mouth every 6 (six) hours as needed for severe pain (breakthrough pain). 09/21/19   Eric Ortega, [place] N, PA-C  ibuprofen (ADVIL,MOTRIN) 200 MG tablet Take 600 mg by mouth every 6 (six) hours as needed for headache or mild pain.    [provider]  lisinopril (PRINIVIL,ZESTRIL) 10 MG tablet Take 10 mg by mouth daily. 12/02/16   [provider]  meloxicam (MOBIC) 7.5 MG tablet Take 1 tablet (7.5 mg total) by mouth daily. 01/06/18   Lorin Glass, PA-C  metFORMIN (  GLUCOPHAGE) 1000 MG tablet Take 1,000 mg by mouth 2 (two) times daily with a meal.    [provider]  methocarbamol (ROBAXIN) 750 MG tablet Take 1-2 tablets (750-1,500 mg total) by mouth 3 (three) times daily as needed for muscle spasms. 01/06/18   Lorin Glass, PA-C  mupirocin ointment (BACTROBAN) 2 % Apply 1 application topically 3 (three) times daily. 06/07/14   Kandra Nicolas, MD  ondansetron (ZOFRAN) 4 MG tablet Take 1 tablet (4 mg total) by mouth every 6 (six) hours as needed for nausea or vomiting. 06/18/17   Julianne Rice, MD  [facility] DELICA LANCETS 28Z MISC 1 each by Does not apply route as needed.  04/03/14   [provider]  oxyCODONE-acetaminophen (PERCOCET) 5-325 MG tablet Take 1-2 tablets by mouth every 6 (six) hours as needed for severe pain. 06/18/17   Julianne Rice, MD  pravastatin (PRAVACHOL) 40 MG tablet Take 40 mg by mouth daily.    [provider]  predniSONE (STERAPRED UNI-PAK) 10 MG tablet Start taking Friday December 4th.  Take as directed for 6 days. 09/07/14   Jacqulyn Cane, MD  tamsulosin (FLOMAX) 0.4 MG CAPS capsule Take 1 capsule (0.4 mg total) by mouth daily. 06/18/17   Julianne Rice, MD  traZODone (DESYREL) 100 MG tablet Take 100 mg by mouth at bedtime. 07/04/16 07/04/17  [provider]    Allergies    Patient has no known allergies.  Review of Systems   Review of Systems  All other systems reviewed and are negative.   Physical Exam Updated Vital Signs BP (!) 163/87 (BP Location: Right Arm)   Pulse 64   Temp 97.8 F (36.6 C) (Oral)   Resp 20   SpO2 99%   Physical Exam Vitals and nursing note reviewed.  Constitutional:      General: He is not in acute distress.    Appearance: He is well-developed.  HENT:     Head: Normocephalic and atraumatic.  Eyes:     Conjunctiva/sclera: Conjunctivae normal.  Neck:     Comments: c-collar in place, no TTP to the c-spine or paraspinal musculature Cardiovascular:     Rate and Rhythm: Normal rate and regular rhythm.  Pulmonary:     Effort: Pulmonary effort is normal. No respiratory distress.     Breath sounds: Normal breath sounds.  Abdominal:     Palpations: Abdomen is soft.  Musculoskeletal:     Comments: Pt rolled maintaining spine precautions. TTP to low T-spine and mid L-spine, no bony step-offs or gross deformities.  Skin:    General: Skin is warm.  Neurological:     Mental Status: He is alert.     Comments: Mental Status:  Alert, oriented, thought content appropriate, able to give a coherent history. Speech fluent without evidence of aphasia. Able to follow 2 step commands  without difficulty.  Cranial Nerves:  II:  Peripheral visual fields grossly normal, pupils equal, round, reactive to light III,IV, VI: ptosis not present, extra-ocular motions intact bilaterally  V,VII: smile symmetric, facial light touch sensation equal VIII: hearing grossly normal to voice  X: uvula elevates symmetrically  XI: bilateral shoulder shrug symmetric and strong XII: midline tongue extension without fassiculations Motor:  Normal tone. 5/5 in upper and lower extremities bilaterally including strong and equal grip strength and dorsiflexion/plantar flexion Sensory: grossly normal in all extremities.  Cerebellar: normal finger-to-nose with bilateral upper extremities CV: distal pulses palpable throughout    Psychiatric:  Behavior: Behavior normal.     ED Results / Procedures / Treatments   Labs (all labs ordered are listed, but only abnormal results are displayed) Labs Reviewed - No data to display  EKG None  Radiology DG Thoracic Spine 2 View  Result Date: 09/21/2019 CLINICAL DATA:  Fall on icy ramp. EXAM: THORACIC SPINE 2 VIEWS COMPARISON:  None FINDINGS: There is no evidence of thoracic spine fracture. Alignment is normal. Multi level disc space narrowing and ventral osteophyte formation noted throughout the thoracic spine. IMPRESSION: 1. No acute findings. 2. Thoracic spondylosis. Electronically Signed   By: Kerby Moors M.D.   On: 09/21/2019 11:04   DG Lumbar Spine Complete  Result Date: 09/21/2019 CLINICAL DATA:  Recent fall with low back pain, initial encounter EXAM: LUMBAR SPINE - COMPLETE 4+ VIEW COMPARISON:  09/07/2014 FINDINGS: Five lumbar type vertebral bodies are well visualized. Vertebral body height is well maintained. Multilevel osteophytic changes are noted. Facet hypertrophic changes are seen. No significant disc space narrowing is noted. No pars defects are identified. No soft tissue abnormality is seen. IMPRESSION: Degenerative change without  acute abnormality. Electronically Signed   By: Inez Catalina M.D.   On: 09/21/2019 11:05   CT Head Wo Contrast  Result Date: 09/21/2019 CLINICAL DATA:  Headache, fall, head and neck pain EXAM: CT HEAD WITHOUT CONTRAST CT CERVICAL SPINE WITHOUT CONTRAST TECHNIQUE: Multidetector CT imaging of the head and cervical spine was performed following the standard protocol without intravenous contrast. Multiplanar CT image reconstructions of the cervical spine were also generated. COMPARISON:  None. FINDINGS: CT HEAD FINDINGS Brain: No evidence of acute infarction, hemorrhage, hydrocephalus, extra-axial collection or mass lesion/mass effect. Vascular: No hyperdense vessel or unexpected calcification. Skull: Normal. Negative for fracture or focal lesion. Sinuses/Orbits: No acute finding. Other: None. CT CERVICAL SPINE FINDINGS Examination of the cervical spine is limited by patient motion artifact throughout Alignment: Normal. Skull base and vertebrae: No acute fracture. No primary bone lesion or focal pathologic process. Soft tissues and spinal canal: No prevertebral fluid or swelling. No visible canal hematoma. Disc levels: Mild to moderate multilevel disc space height loss and osteophytosis. Upper chest: Negative. Other: None. IMPRESSION: 1.  No acute intracranial pathology. 2. Examination of the cervical spine is limited by patient motion artifact throughout. Within this limitation, no obvious displaced fracture or static subluxation of the cervical spine. 3.  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 Electronically Signed   By: Eddie Candle M.D.   On: 09/21/2019 11:16   CT Cervical Spine Wo Contrast  Result Date: 09/21/2019 CLINICAL DATA:  Headache, fall, head and neck pain EXAM: CT HEAD WITHOUT CONTRAST CT CERVICAL SPINE WITHOUT CONTRAST TECHNIQUE: Multidetector CT imaging of the head and cervical spine was performed following the standard protocol without intravenous contrast. Multiplanar CT  image reconstructions of the cervical spine were also generated. COMPARISON:  None. FINDINGS: CT HEAD FINDINGS Brain: No evidence of acute infarction, hemorrhage, hydrocephalus, extra-axial collection or mass lesion/mass effect. Vascular: No hyperdense vessel or unexpected calcification. Skull: Normal. Negative for fracture or focal lesion. Sinuses/Orbits: No acute finding. Other: None. CT CERVICAL SPINE FINDINGS Examination of the cervical spine is limited by patient motion artifact throughout Alignment: Normal. Skull base and vertebrae: No acute fracture. No primary bone lesion or focal pathologic process. Soft tissues and spinal canal: No prevertebral fluid or swelling. No visible canal hematoma. Disc levels: Mild to moderate multilevel disc space height loss and osteophytosis. Upper chest: Negative. Other: None. IMPRESSION: 1.  No  acute intracranial pathology. 2. Examination of the cervical spine is limited by patient motion artifact throughout. Within this limitation, no obvious displaced fracture or static subluxation of the cervical spine. 3.  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 Electronically Signed   By: Eddie Candle M.D.   On: 09/21/2019 11:16   CT Thoracic Spine Wo Contrast  Result Date: 09/21/2019 CLINICAL DATA:  Back pain. Slipped on ice this morning and fell backwards. EXAM: CT THORACIC AND LUMBAR SPINE WITHOUT CONTRAST TECHNIQUE: Multidetector CT imaging of the thoracic and lumbar spine was performed without contrast. Multiplanar CT image reconstructions were also generated. COMPARISON:  Thoracic and lumbar spine radiographs 09/21/2019. Lumbar spine MRI 01/06/2018. CT abdomen and pelvis 06/18/2017. FINDINGS: CT THORACIC SPINE FINDINGS Alignment: Normal. Vertebrae: No fracture or suspicious osseous lesion. Paraspinal and other soft tissues: Approximately 2 cm hypoattenuating nodule in the right thyroid lobe. Three-vessel coronary artery atherosclerosis. Disc levels:  Thoracic spondylosis with diffuse bridging vertebral osteophytes. Facet arthrosis from C7-T1 to T2-3 without evidence of compressive neural foraminal stenosis. No evidence of significant spinal stenosis. CT LUMBAR SPINE FINDINGS Segmentation: 5 lumbar type vertebrae. Alignment: Mild straightening of the normal lumbar lordosis, unchanged. No listhesis. Vertebrae: No fracture or destructive osseous process. 1.2 cm sclerotic focus in the left ilium, unchanged from 2018 and likely benign. Mild bilateral SI joint degenerative changes. Paraspinal and other soft tissues: Mild abdominal aortic atherosclerosis without aneurysm. Disc levels: Similar appearance of lumbar spondylosis and facet arthrosis compared to the prior MRI with mild multifactorial spinal stenosis at L4-5 and up to moderate neural foraminal stenosis from L3-4 to L5-S1. IMPRESSION: 1. No acute osseous abnormality identified in the thoracic or lumbar spine. 2. Similar lumbar spondylosis compared to the 2019 MRI with mild spinal stenosis at L4-5 and mild-to-moderate multilevel neural foraminal stenosis. 3. No evidence of significant thoracic spinal stenosis. 4. Approximately 2 cm right thyroid nodule. Recommend further evaluation with thyroid ultrasound. (Ref: J Am Coll Radiol. 2015 Feb;12(2): 143-50). Electronically Signed   By: Logan Bores M.D.   On: 09/21/2019 12:42   CT Lumbar Spine Wo Contrast  Result Date: 09/21/2019 CLINICAL DATA:  Back pain. Slipped on ice this morning and fell backwards. EXAM: CT THORACIC AND LUMBAR SPINE WITHOUT CONTRAST TECHNIQUE: Multidetector CT imaging of the thoracic and lumbar spine was performed without contrast. Multiplanar CT image reconstructions were also generated. COMPARISON:  Thoracic and lumbar spine radiographs 09/21/2019. Lumbar spine MRI 01/06/2018. CT abdomen and pelvis 06/18/2017. FINDINGS: CT THORACIC SPINE FINDINGS Alignment: Normal. Vertebrae: No fracture or suspicious osseous lesion. Paraspinal and  other soft tissues: Approximately 2 cm hypoattenuating nodule in the right thyroid lobe. Three-vessel coronary artery atherosclerosis. Disc levels: Thoracic spondylosis with diffuse bridging vertebral osteophytes. Facet arthrosis from C7-T1 to T2-3 without evidence of compressive neural foraminal stenosis. No evidence of significant spinal stenosis. CT LUMBAR SPINE FINDINGS Segmentation: 5 lumbar type vertebrae. Alignment: Mild straightening of the normal lumbar lordosis, unchanged. No listhesis. Vertebrae: No fracture or destructive osseous process. 1.2 cm sclerotic focus in the left ilium, unchanged from 2018 and likely benign. Mild bilateral SI joint degenerative changes. Paraspinal and other soft tissues: Mild abdominal aortic atherosclerosis without aneurysm. Disc levels: Similar appearance of lumbar spondylosis and facet arthrosis compared to the prior MRI with mild multifactorial spinal stenosis at L4-5 and up to moderate neural foraminal stenosis from L3-4 to L5-S1. IMPRESSION: 1. No acute osseous abnormality identified in the thoracic or lumbar spine. 2. Similar lumbar spondylosis compared to the 2019 MRI with  mild spinal stenosis at L4-5 and mild-to-moderate multilevel neural foraminal stenosis. 3. No evidence of significant thoracic spinal stenosis. 4. Approximately 2 cm right thyroid nodule. Recommend further evaluation with thyroid ultrasound. (Ref: J Am Coll Radiol. 2015 Feb;12(2): 143-50). Electronically Signed   By: Logan Bores M.D.   On: 09/21/2019 12:42    Procedures Procedures (including critical care time)  Medications Ordered in ED Medications  oxyCODONE-acetaminophen (PERCOCET/ROXICET) 5-325 MG per tablet 1 tablet (1 tablet Oral Given 09/21/19 1143)  ondansetron (ZOFRAN-ODT) disintegrating tablet 4 mg (4 mg Oral Given 09/21/19 1142)  ketorolac (TORADOL) 30 MG/ML injection 30 mg (30 mg Intramuscular Given 09/21/19 1340)  oxyCODONE-acetaminophen (PERCOCET/ROXICET) 5-325 MG per tablet  1 tablet (1 tablet Oral Given 09/21/19 1350)  methocarbamol (ROBAXIN) tablet 500 mg (500 mg Oral Given 09/21/19 1350)    ED Course  I have reviewed the triage vital signs and the nursing notes.  Pertinent labs & imaging results that were available during my care of the patient were reviewed by me and considered in my medical decision making (see chart for details).  Clinical Course as of Sep 20 1517  Wed Sep 21, 2019  1425 Pt ambulated without difficulty, feels better and is ready for d/c   [JR]    Clinical Course User Index [JR] Gaurav Baldree, [place] N, PA-C   MDM Rules/Calculators/A&P                      Patient presenting with headache and back pain after mechanical fall by slipping on ice this morning.  He had brief few second episode of LOC.  Not on anticoagulation.  On evaluation patient is in c-collar though in no distress.  No focal neuro deficits.  He does have tenderness to the lower thoracic and mid lumbar spine.  CT imaging initiated in triage of head and C-spine are negative.  Plain films of T-spine and L-spine are negative, however turned for missed fracture diagnosis therefore CT imaging was obtained.  Pain treated.  CT imaging is negative for evidence of acute injury.  Likely strain as cause of patient's back pain.  Also discussed concern for mild concussion.  He will be discharged with concussion precautions and symptomatic management regarding back pain.  He has improvement in symptoms after intervention and is ambulating in the ED without difficulty.  Discussed importance of PCP follow-up, concussion precautions, and strict return precautions.  Patient verbalized understanding agrees with care plan for discharge.  Discussed results, findings, treatment and follow up. Patient advised of return precautions. Patient verbalized understanding and agreed with plan.  [place] Controlled Substance reporting System queried  Final Clinical Impression(s) / ED Diagnoses Final  diagnoses:  Fall in home, initial encounter  Concussion with loss of consciousness of 30 minutes or less, initial encounter  Acute bilateral back pain, unspecified back location    Rx / DC Orders ED Discharge Orders         Ordered    cyclobenzaprine (FLEXERIL) 10 MG tablet  2 times daily PRN,   Status:  Discontinued     09/21/19 1410    HYDROcodone-acetaminophen (NORCO/VICODIN) 5-325 MG tablet  Every 6 hours PRN,   Status:  Discontinued     09/21/19 1410    cyclobenzaprine (FLEXERIL) 10 MG tablet  2 times daily PRN     09/21/19 1420    HYDROcodone-acetaminophen (NORCO/VICODIN) 5-325 MG tablet  Every 6 hours PRN     09/21/19 1420  Charliegh Vasudevan, [place] N, PA-C 09/21/19 1519    Blanchie Dessert, MD 09/22/19 2119

## 2020-08-05 DIAGNOSIS — N5201 Erectile dysfunction due to arterial insufficiency: Secondary | ICD-10-CM | POA: Insufficient documentation

## 2020-10-19 ENCOUNTER — Encounter (HOSPITAL_COMMUNITY): Payer: Self-pay

## 2020-10-19 ENCOUNTER — Other Ambulatory Visit: Payer: Self-pay

## 2020-10-19 ENCOUNTER — Emergency Department (HOSPITAL_COMMUNITY): Payer: Managed Care, Other (non HMO)

## 2020-10-19 ENCOUNTER — Emergency Department (HOSPITAL_COMMUNITY)
Admission: EM | Admit: 2020-10-19 | Discharge: 2020-10-19 | Disposition: A | Discharge status: Home / Self Care | Payer: Managed Care, Other (non HMO) | Attending: Emergency Medicine | Admitting: Emergency Medicine

## 2020-10-19 DIAGNOSIS — E785 Hyperlipidemia, unspecified: Secondary | ICD-10-CM | POA: Diagnosis not present

## 2020-10-19 DIAGNOSIS — M25551 Pain in right hip: Secondary | ICD-10-CM | POA: Diagnosis present

## 2020-10-19 DIAGNOSIS — M5431 Sciatica, right side: Secondary | ICD-10-CM | POA: Insufficient documentation

## 2020-10-19 DIAGNOSIS — Z79899 Other long term (current) drug therapy: Secondary | ICD-10-CM | POA: Insufficient documentation

## 2020-10-19 DIAGNOSIS — W010XXA Fall on same level from slipping, tripping and stumbling without subsequent striking against object, initial encounter: Secondary | ICD-10-CM | POA: Insufficient documentation

## 2020-10-19 DIAGNOSIS — E1169 Type 2 diabetes mellitus with other specified complication: Secondary | ICD-10-CM | POA: Insufficient documentation

## 2020-10-19 DIAGNOSIS — Z7984 Long term (current) use of oral hypoglycemic drugs: Secondary | ICD-10-CM | POA: Diagnosis not present

## 2020-10-19 DIAGNOSIS — Z87891 Personal history of nicotine dependence: Secondary | ICD-10-CM | POA: Insufficient documentation

## 2020-10-19 DIAGNOSIS — I10 Essential (primary) hypertension: Secondary | ICD-10-CM | POA: Diagnosis not present

## 2020-10-19 LAB — CBC WITH DIFFERENTIAL/PLATELET
Abs Immature Granulocytes: 0.03 10*3/uL (ref 0.00–0.07)
Basophils Absolute: 0.1 10*3/uL (ref 0.0–0.1)
Basophils Relative: 1 %
Eosinophils Absolute: 0.3 10*3/uL (ref 0.0–0.5)
Eosinophils Relative: 5 %
HCT: 45.6 % (ref 39.0–52.0)
Hemoglobin: 15.7 g/dL (ref 13.0–17.0)
Immature Granulocytes: 1 %
Lymphocytes Relative: 20 %
Lymphs Abs: 1 10*3/uL (ref 0.7–4.0)
MCH: 30 pg (ref 26.0–34.0)
MCHC: 34.4 g/dL (ref 30.0–36.0)
MCV: 87 fL (ref 80.0–100.0)
Monocytes Absolute: 0.4 10*3/uL (ref 0.1–1.0)
Monocytes Relative: 7 %
Neutro Abs: 3.4 10*3/uL (ref 1.7–7.7)
Neutrophils Relative %: 66 %
Platelets: 84 10*3/uL — ABNORMAL LOW (ref 150–400)
RBC: 5.24 MIL/uL (ref 4.22–5.81)
RDW: 14.2 % (ref 11.5–15.5)
WBC: 5.1 10*3/uL (ref 4.0–10.5)
nRBC: 0 % (ref 0.0–0.2)

## 2020-10-19 LAB — I-STAT CHEM 8, ED
BUN: 11 mg/dL (ref 8–23)
Calcium, Ion: 1.09 mmol/L — ABNORMAL LOW (ref 1.15–1.40)
Chloride: 107 mmol/L (ref 98–111)
Creatinine, Ser: 0.6 mg/dL — ABNORMAL LOW (ref 0.61–1.24)
Glucose, Bld: 214 mg/dL — ABNORMAL HIGH (ref 70–99)
HCT: 44 % (ref 39.0–52.0)
Hemoglobin: 15 g/dL (ref 13.0–17.0)
Potassium: 5.2 mmol/L — ABNORMAL HIGH (ref 3.5–5.1)
Sodium: 134 mmol/L — ABNORMAL LOW (ref 135–145)
TCO2: 22 mmol/L (ref 22–32)

## 2020-10-19 LAB — BASIC METABOLIC PANEL
Anion gap: 10 (ref 5–15)
BUN: 8 mg/dL (ref 8–23)
CO2: 19 mmol/L — ABNORMAL LOW (ref 22–32)
Calcium: 8.5 mg/dL — ABNORMAL LOW (ref 8.9–10.3)
Chloride: 104 mmol/L (ref 98–111)
Creatinine, Ser: 0.72 mg/dL (ref 0.61–1.24)
GFR, Estimated: >60 mL/min (ref >60)
Glucose, Bld: 216 mg/dL — ABNORMAL HIGH (ref 70–99)
Potassium: 5.1 mmol/L (ref 3.5–5.1)
Sodium: 133 mmol/L — ABNORMAL LOW (ref 135–145)

## 2020-10-19 MED ORDER — HYDROMORPHONE HCL 1 MG/ML IJ SOLN
1.0000 mg | Freq: Once | INTRAMUSCULAR | Status: AC
  Administered 2020-10-19: 1 mg via INTRAVENOUS
  Filled 2020-10-19: qty 1

## 2020-10-19 MED ORDER — METHOCARBAMOL 500 MG PO TABS: 500.0000 mg | ORAL_TABLET | Freq: Two times a day (BID) | ORAL | 0 refills | Status: DC | PRN

## 2020-10-19 MED ORDER — DEXAMETHASONE 4 MG PO TABS
10.0000 mg | ORAL_TABLET | Freq: Once | ORAL | Status: AC
  Administered 2020-10-19: 10 mg via ORAL
  Filled 2020-10-19: qty 3

## 2020-10-19 MED ORDER — HYDROCODONE-ACETAMINOPHEN 5-325 MG PO TABS
2.0000 | ORAL_TABLET | Freq: Once | ORAL | Status: AC
  Administered 2020-10-19: 2 via ORAL
  Filled 2020-10-19: qty 2

## 2020-10-19 MED ORDER — LIDOCAINE 5 % EX PTCH
1.0000 | MEDICATED_PATCH | Freq: Once | CUTANEOUS | Status: DC
  Administered 2020-10-19: 1 via TRANSDERMAL
  Filled 2020-10-19: qty 1

## 2020-10-19 MED ORDER — METHOCARBAMOL 500 MG PO TABS
500.0000 mg | ORAL_TABLET | Freq: Once | ORAL | Status: AC
  Administered 2020-10-19: 500 mg via ORAL
  Filled 2020-10-19: qty 1

## 2020-10-19 MED ORDER — IOHEXOL 350 MG/ML SOLN
100.0000 mL | Freq: Once | INTRAVENOUS | Status: AC | PRN
  Administered 2020-10-19: 100 mL via INTRAVENOUS

## 2020-10-19 MED ORDER — ATENOLOL 50 MG PO TABS
100.0000 mg | ORAL_TABLET | Freq: Once | ORAL | Status: AC
  Administered 2020-10-19: 100 mg via ORAL
  Filled 2020-10-19: qty 2

## 2020-10-19 MED ORDER — HYDROCODONE-ACETAMINOPHEN 5-325 MG PO TABS: 1.0000 | ORAL_TABLET | Freq: Once | ORAL | Status: DC

## 2020-10-19 MED ORDER — ONDANSETRON HCL 4 MG/2ML IJ SOLN
4.0000 mg | Freq: Once | INTRAMUSCULAR | Status: AC
  Administered 2020-10-19: 4 mg via INTRAVENOUS
  Filled 2020-10-19: qty 2

## 2020-10-19 MED ORDER — MORPHINE SULFATE (PF) 4 MG/ML IV SOLN
4.0000 mg | Freq: Once | INTRAVENOUS | Status: AC
  Administered 2020-10-19: 4 mg via INTRAVENOUS
  Filled 2020-10-19: qty 1

## 2020-10-19 MED ORDER — KETOROLAC TROMETHAMINE 15 MG/ML IJ SOLN
15.0000 mg | Freq: Once | INTRAMUSCULAR | Status: AC
  Administered 2020-10-19: 15 mg via INTRAMUSCULAR
  Filled 2020-10-19: qty 1

## 2020-10-19 MED ORDER — HYDRALAZINE HCL 25 MG PO TABS
25.0000 mg | ORAL_TABLET | Freq: Once | ORAL | Status: AC
  Administered 2020-10-19: 25 mg via ORAL
  Filled 2020-10-19: qty 1

## 2020-10-19 NOTE — ED Triage Notes (Signed)
Pt reports right hip pain since Sunday, states he almost tripped and fell Sunday but did not injury himself. States the pain radiates down his leg and to his back. Pt ambulatory

## 2020-10-19 NOTE — Discharge Instructions (Addendum)
-  Follow-up with primary care doctor as soon as possible to have your blood pressure rechecked.  If it stays elevated it is possible your doctor will make a change to your blood pressure medications.  Prescription sent to your pharmacy for robaxin. This is a muscle relaxer. It can make you drowsy so do not drive or work when taking it.  -You can also try to buy over the counter lidocaine patches to help with your pain.  Return to the emergency department for new or worsening symptoms including headache, neck pain, vision changes, difficulty breathing, chest pain.

## 2020-10-19 NOTE — ED Notes (Signed)
Patient transported to CT 

## 2020-10-19 NOTE — ED Notes (Signed)
Pt transported to CT ?

## 2020-10-19 NOTE — ED Provider Notes (Signed)
[facility] EMERGENCY DEPARTMENT Provider Note   CSN: [id number] Arrival date & time: 10/19/20  5400     History Chief Complaint  Patient presents with  . Hip Pain    Albert Hersch is a 60 y.o. male with past medical history significant for hypertension, hyperlipidemia and diabetes.  HPI Presents to emergency department today with chief complaint of progressively worsening right hip pain x5 days.  Patient states the pain started after he had a near fall.  He was walking and there was a small ledge that he did not see and stumbled over.  A nearby person was able to catch him and he did not fall.  He thinks he might has moved his back or right hip in an unusual way.  He is describing sharp and shooting pain.  It is located in his right hip and radiates down the back of his right leg.  He states the pain is constant.  He rates the pain 10 out of 10 in severity.  He has tried taking Tylenol and using heating pad without any symptom relief.  Denies history of the same pain.  Does have a history of gout and states this does not feel like that.  He has not had a gout flare in over 20 years.  Denies fevers, weight loss, numbness/weakness of upper and lower extremities, bowel/bladder incontinence, urinary retention, history of cancer, saddle anesthesia, history of back surgery, history of IVDA.    Past Medical History:  Diagnosis Date  . Diabetes mellitus without complication ([place])   . Hyperlipidemia   . Hypertension     There are no problems to display for this patient.   Past Surgical History:  Procedure Laterality Date  . APPENDECTOMY         Family History  Problem Relation Age of Onset  . Diabetes Mother   . Heart failure Mother   . Heart failure Father     Social History   Tobacco Use  . Smoking status: Former Research scientist (life sciences)  . Smokeless tobacco: Never Used  Substance Use Topics  . Alcohol use: No  . Drug use: No    Home Medications Prior to Admission  medications   Medication Sig Start Date End Date Taking? Authorizing Provider  methocarbamol (ROBAXIN) 500 MG tablet Take 1 tablet (500 mg total) by mouth 2 (two) times daily as needed for muscle spasms. 10/19/20  Yes Walisiewicz,  E, PA-C  allopurinol (ZYLOPRIM) 100 MG tablet Take 100 mg by mouth daily.    [provider]  amitriptyline (ELAVIL) 150 MG tablet Take 150 mg by mouth at bedtime.    [provider]  atenolol (TENORMIN) 100 MG tablet Take 100 mg by mouth every evening.     [provider]  Blood Glucose Monitoring Suppl (ONETOUCH VERIO) w/Device KIT 1 each by Does not apply route as needed. 04/03/14   [provider]  citalopram (CELEXA) 40 MG tablet Take 40 mg by mouth daily.    [provider]  clonazePAM (KLONOPIN) 0.5 MG tablet Take 0.5 mg by mouth 2 (two) times daily as needed for anxiety.    [provider]  glimepiride (AMARYL) 2 MG tablet Take 2 mg by mouth daily with breakfast.    [provider]  HYDROcodone-acetaminophen (NORCO/VICODIN) 5-325 MG tablet Take 1 tablet by mouth every 6 (six) hours as needed for severe pain (breakthrough pain). 09/21/19   Robinson, [place] N, PA-C  ibuprofen (ADVIL,MOTRIN) 200 MG tablet Take 600 mg  by mouth every 6 (six) hours as needed for headache or mild pain.    [provider]  lisinopril (PRINIVIL,ZESTRIL) 10 MG tablet Take 10 mg by mouth daily. 12/02/16   [provider]  meloxicam (MOBIC) 7.5 MG tablet Take 1 tablet (7.5 mg total) by mouth daily. 01/06/18   Lorin Glass, PA-C  metFORMIN (GLUCOPHAGE) 1000 MG tablet Take 1,000 mg by mouth 2 (two) times daily with a meal.    [provider]  mupirocin ointment (BACTROBAN) 2 % Apply 1 application topically 3 (three) times daily. 06/07/14   Kandra Nicolas, MD  ondansetron (ZOFRAN) 4 MG tablet Take 1 tablet (4 mg total) by mouth every 6 (six) hours as needed for nausea or vomiting. 06/18/17    Julianne Rice, MD  [facility] DELICA LANCETS 25Z MISC 1 each by Does not apply route as needed. 04/03/14   [provider]  oxyCODONE-acetaminophen (PERCOCET) 5-325 MG tablet Take 1-2 tablets by mouth every 6 (six) hours as needed for severe pain. 06/18/17   Julianne Rice, MD  pravastatin (PRAVACHOL) 40 MG tablet Take 40 mg by mouth daily.    [provider]  predniSONE (STERAPRED UNI-PAK) 10 MG tablet Start taking Friday December 4th.  Take as directed for 6 days. 09/07/14   Jacqulyn Cane, MD  tamsulosin (FLOMAX) 0.4 MG CAPS capsule Take 1 capsule (0.4 mg total) by mouth daily. 06/18/17   Julianne Rice, MD  traZODone (DESYREL) 100 MG tablet Take 100 mg by mouth at bedtime. 07/04/16 07/04/17  [provider]    Allergies    Patient has no known allergies.  Review of Systems   Review of Systems All other systems are reviewed and are negative for acute change except as noted in the HPI.  Physical Exam Updated Vital Signs BP (!) 219/94 (BP Location: Left Arm)   Pulse 79   Temp 98.3 F (36.8 C) (Oral)   Resp 18   Ht 6' (1.829 m)   Wt 109.3 kg   SpO2 100%   BMI 32.69 kg/m   Physical Exam Vitals and nursing note reviewed.  Constitutional:      Appearance: He is well-developed. He is not ill-appearing or toxic-appearing.  HENT:     Head: Normocephalic and atraumatic.     Nose: Nose normal.  Eyes:     General: No scleral icterus.       Right eye: No discharge.        Left eye: No discharge.     Conjunctiva/sclera: Conjunctivae normal.  Neck:     Vascular: No JVD.  Cardiovascular:     Rate and Rhythm: Normal rate and regular rhythm.     Pulses: Normal pulses.          Dorsalis pedis pulses are 2+ on the right side and 2+ on the left side.     Heart sounds: Normal heart sounds.  Pulmonary:     Effort: Pulmonary effort is normal.     Breath sounds: Normal breath sounds.  Abdominal:     General: There is no distension.     Comments: Splenomegaly.  No pulsatile mass  Musculoskeletal:        General: Normal range of motion.     Cervical back: Normal range of motion.       Back:     Right lower leg: No edema.     Left lower leg: No edema.     Comments: No overlying skin changes.  Skin:  General: Skin is warm and dry.     Capillary Refill: Capillary refill takes less than 2 seconds.     Comments: Equal tactile temperature in all extremities  Neurological:     Mental Status: He is oriented to person, place, and time.     GCS: GCS eye subscore is 4. GCS verbal subscore is 5. GCS motor subscore is 6.     Comments: Speech is clear and goal oriented, follows commands CN III-XII intact, no facial droop Normal strength in upper and lower extremities bilaterally including dorsiflexion and plantar flexion, strong and equal grip strength Sensation normal to light and sharp touch Moves extremities without ataxia, coordination intact Normal finger to nose and rapid alternating movements Antalgic gait. Normal balance   Psychiatric:        Behavior: Behavior normal.     ED Results / Procedures / Treatments   Labs (all labs ordered are listed, but only abnormal results are displayed) Labs Reviewed  CBC WITH DIFFERENTIAL/PLATELET - Abnormal; Notable for the following components:      Result Value   Platelets 84 (*)    All other components within normal limits  BASIC METABOLIC PANEL - Abnormal; Notable for the following components:   Sodium 133 (*)    CO2 19 (*)    Glucose, Bld 216 (*)    Calcium 8.5 (*)    All other components within normal limits  I-STAT CHEM 8, ED - Abnormal; Notable for the following components:   Sodium 134 (*)    Potassium 5.2 (*)    Creatinine, Ser 0.60 (*)    Glucose, Bld 214 (*)    Calcium, Ion 1.09 (*)    All other components within normal limits    EKG EKG Interpretation  Date/Time:  Friday October 19 2020 11:21:29 EST Ventricular Rate:  75 PR Interval:  182 QRS Duration: 94 QT  Interval:  416 QTC Calculation: 464 R Axis:   3 Text Interpretation: Normal sinus rhythm Normal ECG No previous ECGs available Confirmed by Ezequiel Essex [phone]) on 10/19/2020 11:26:39 AM   Radiology CT Angio Chest/Abd/Pel for Dissection W and/or W/WO  Result Date: 10/19/2020 CLINICAL DATA:  Chest pain.  Concern for aortic dissection. EXAM: CT ANGIOGRAPHY CHEST, ABDOMEN AND PELVIS TECHNIQUE: Non-contrast CT of the chest was initially obtained. Multidetector CT imaging through the chest, abdomen and pelvis was performed using the standard protocol during bolus administration of intravenous contrast. Multiplanar reconstructed images and MIPs were obtained and reviewed to evaluate the vascular anatomy. CONTRAST:  119m OMNIPAQUE IOHEXOL 350 MG/ML SOLN COMPARISON:  CT thoracic spine 09/21/2019 FINDINGS: CTA CHEST FINDINGS Cardiovascular: Non IV contrast imaging demonstrates no intramural hematoma within the thoracic aorta. Three-vessel coronary artery calcification noted. Contrast enhanced series demonstrates no aortic dissection or aneurysm. No evidence of pulmonary embolism. Mediastinum/Nodes: No axillary or supraclavicular adenopathy. No mediastinal or hilar adenopathy. No pericardial fluid. Esophagus normal. Lungs/Pleura: No suspicious pulmonary nodules. Airways normal. No pneumonia. No infarction or infiltrate Musculoskeletal: Degenerative osteophytosis of the spine. Review of the MIP images confirms the above findings. CTA ABDOMEN AND PELVIS FINDINGS VASCULAR Aorta: No aortic dissection or aneurysm. Celiac: Widely patent SMA: Widely patent Renals: Single renal arteries.  Widely patent IMA: Widely patent Inflow: Normal Veins: Normal Review of the MIP images confirms the above findings. NON-VASCULAR Hepatobiliary: Nodular contour of the liver. Gallbladder normal. Small amount sludge within the neck of the gallbladder. Pancreas: No pancreatic inflammation. Spleen: Spleen is enlarged to 15.5 cm in  craniocaudad dimension. Splenorenal  shunt noted. Adrenals/Urinary Tract: Adrenal glands, kidneys normal. Ureters and bladder normal. Stomach/Bowel: Stomach, small bowel and colon unremarkable. Lymphatic: No retroperitoneal lymphadenopathy. No pelvic lymphadenopathy. Reproductive: Prostate unremarkable Other: No abdominal hernia. Musculoskeletal: Degenerative osteophytosis of the spine. Review of the MIP images confirms the above findings. IMPRESSION: Chest Impression: 1. No evidence thoracic aortic dissection or aneurysm. 2. No evidence of pulmonary embolism. 3. Three-vessel coronary artery calcification and Aortic Atherosclerosis (ICD10-I70.0). Abdomen / Pelvis Impression: 1. No aneurysm or dissection abdominal aorta. 2. Morphologic changes in liver consistent with cirrhosis. Portal hypertension evident with splenomegaly and splenorenal shunt. 3. Gallbladder sludge. Electronically Signed   By: Suzy Bouchard M.D.   On: 10/19/2020 12:22   DG Hip Unilat  With Pelvis 2-3 Views Right  Result Date: 10/19/2020 CLINICAL DATA:  60 year old male with right hip pain for 5 days. Pain radiating to the back and down the leg. EXAM: DG HIP (WITH OR WITHOUT PELVIS) 2-3V RIGHT COMPARISON:  CT Abdomen and Pelvis 06/18/2017. FINDINGS: Femoral heads normally located. Degenerative, hypertrophic bony changes about the pelvis and hips but hip joint spaces appear preserved. Proximal right femur appears intact. Intact pelvis and visible proximal left femur. Negative visible bowel gas pattern. IMPRESSION: No acute osseous abnormality identified about the right hip or pelvis. Electronically Signed   By: Genevie Ann M.D.   On: 10/19/2020 08:22    Procedures Procedures (including critical care time)  Medications Ordered in ED Medications  lidocaine (LIDODERM) 5 % 1 patch (1 patch Transdermal Patch Applied 10/19/20 1139)  HYDROmorphone (DILAUDID) injection 1 mg (has no administration in time range)  ketorolac (TORADOL) 15 MG/ML  injection 15 mg (15 mg Intramuscular Given 10/19/20 0903)  dexamethasone (DECADRON) tablet 10 mg (10 mg Oral Given 10/19/20 0901)  HYDROcodone-acetaminophen (NORCO/VICODIN) 5-325 MG per tablet 2 tablet (2 tablets Oral Given 10/19/20 0902)  methocarbamol (ROBAXIN) tablet 500 mg (500 mg Oral Given 10/19/20 0902)  morphine 4 MG/ML injection 4 mg (4 mg Intravenous Given 10/19/20 1028)  ondansetron (ZOFRAN) injection 4 mg (4 mg Intravenous Given 10/19/20 1028)  iohexol (OMNIPAQUE) 350 MG/ML injection 100 mL (100 mLs Intravenous Contrast Given 10/19/20 1144)  atenolol (TENORMIN) tablet 100 mg (100 mg Oral Given 10/19/20 1352)  hydrALAZINE (APRESOLINE) tablet 25 mg (25 mg Oral Given 10/19/20 1352)    ED Course  I have reviewed the triage vital signs and the nursing notes.  Pertinent labs & imaging results that were available during my care of the patient were reviewed by me and considered in my medical decision making (see chart for details).    MDM Rules/Calculators/A&P                          History provided by patient with additional history obtained from chart review.    60 year old male presenting with right hip pain.  He is afebrile. Patient noted to be hypertensive in triage at 219/94.  He admits to taking his blood pressure medicine at night.  Has not missed any doses, lisinopril.  Xray of right hip was ordered in triage.  I viewed imaging which does not show any acute fracture or dislocation, agree with radiologist impression.  Chart review shows patient had lab work at PCP office x3 weeks ago with normal renal function. He also had CT AP at that time that showed no evidence of dissection.  Today patient has a normal neuro exam, no evidence of urinary incontinence or retention, pain is insistently reproducible.  There  is no evidence of AAA or concern for dissection at this time.  DP pulses 2+ bilaterally on exam.  He can ambulate with antalgic gait.  No concern for cauda equina. No fever, night  sweats, weight loss, h/o cancer, IVDU.   Reassessed patient after pain medication and repeat BP still elevated. Pain has not improved. Will proceed with basic labs and dissection study per discussion with supervising physician.    I-stat chem 8 with hyperkalemia 5.2, creatinine 0.6. BMP shows creatinine 5.1, hyperglycemia 216 he is known diabetic, normal anion gap. CBC without leukocytosis or anemia.  Does have thrombocytopenia with platelet count of 84 is consistent with his baseline on chart review.  CT negative for dissection.  Radiologist does comment during negative for PE or aneurysm.  Patient has three-vessel coronary artery calcification and aortic atherosclerosis.  Liver with findings consistent of cirrhosis, portal hypertension evident with splenomegaly and splenorenal shunt.  He has gallbladder sludge.  He is followed by with [facility] for this, saw them yesterday. Morphine given for pain.  Patient reports compliance with blood pressure medications, he has been consistently hypertensive in the low 200s.  Labs do not demonstrate evidence of hypertensive urgency or emergency.  Patient did tell me he had BP checked at GI appointment yesterday and he also had a systolic pressure in the 585I.  On reassessment he admits his pain has improved.  Given dose of atenolol and hydralazine. Pain improved. BP still elevated. Suspect this is not new and he will need close follow up with PCP for BP recheck. He is agreeable with plan for close follow up with PCP. Discussed very strict precautions.  Discharged patient home with prescription for Robaxin.  Instructed not to take if he is going to be driving or working as it make him drowsy.  Had long discussion with patient about elevated blood pressure.   The patient appears reasonably screened and/or stabilized for discharge and I doubt any other medical condition or other [facility] requiring further screening, evaluation, or treatment in the ED at this time prior to  discharge. The patient is safe for discharge with strict return precautions discussed. The patient was discussed with and seen by ED supervising physician Dr. Wyvonnia Dusky who agrees with the treatment plan.   Portions of this note were generated with [employer]. Dictation errors may occur despite best attempts at proofreading.   Final Clinical Impression(s) / ED Diagnoses Final diagnoses:  Sciatica of right side    Rx / DC Orders ED Discharge Orders         Ordered    methocarbamol (ROBAXIN) 500 MG tablet  2 times daily PRN        10/19/20 1414           Barrie Folk, PA-C 10/19/20 1614    Ezequiel Essex, MD 10/19/20 1757

## 2020-10-22 NOTE — ED Provider Notes (Signed)
I evaluated patient in the emergency department x3 days ago for hip pain.  He had incidental finding of raised blood pressure despite reported compliance with his 3 hypertension medications. He did not have chest pain and neuro exam was normal. 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  I did discuss these incidental findings with the patient and strongly encouraged him to follow-up with his primary care doctor for blood pressure recheck as well as further evaluation of this finding.  I called patient at home the day after ED discharge and left a HIPAA compliant voicemail to make sure he was not having any new or worsening symptoms. He did not return my call.  I called his primary care doctor's office today to help facilitate close follow-up.  The staff ensures me they will call the patient to schedule the next available appointment to get him into the office as soon as possible. Appreciate their assistance and care for this patient.   Shanon Ace, PA-C 10/22/20 1345    Eber Hong, MD 10/23/20 [phone]

## 2020-10-23 DIAGNOSIS — I161 Hypertensive emergency: Secondary | ICD-10-CM | POA: Insufficient documentation

## 2020-10-23 DIAGNOSIS — M545 Low back pain, unspecified: Secondary | ICD-10-CM | POA: Insufficient documentation

## 2020-10-23 DIAGNOSIS — U071 COVID-19: Secondary | ICD-10-CM | POA: Insufficient documentation

## 2020-10-23 DIAGNOSIS — E1165 Type 2 diabetes mellitus with hyperglycemia: Secondary | ICD-10-CM | POA: Diagnosis present

## 2021-09-29 ENCOUNTER — Emergency Department (HOSPITAL_COMMUNITY): Payer: Self-pay

## 2021-09-29 ENCOUNTER — Encounter (HOSPITAL_COMMUNITY): Payer: Self-pay | Admitting: Student

## 2021-09-29 ENCOUNTER — Observation Stay (HOSPITAL_COMMUNITY): Payer: Self-pay

## 2021-09-29 ENCOUNTER — Other Ambulatory Visit: Payer: Self-pay

## 2021-09-29 ENCOUNTER — Inpatient Hospital Stay (HOSPITAL_COMMUNITY)
Admission: EM | Admit: 2021-09-29 | Discharge: 2021-10-02 | DRG: 312 | Disposition: A | Discharge status: Home / Self Care | Payer: Self-pay | Attending: Family Medicine | Admitting: Family Medicine

## 2021-09-29 DIAGNOSIS — I10 Essential (primary) hypertension: Secondary | ICD-10-CM | POA: Diagnosis present

## 2021-09-29 DIAGNOSIS — N179 Acute kidney failure, unspecified: Secondary | ICD-10-CM | POA: Diagnosis present

## 2021-09-29 DIAGNOSIS — R410 Disorientation, unspecified: Secondary | ICD-10-CM

## 2021-09-29 DIAGNOSIS — K7030 Alcoholic cirrhosis of liver without ascites: Secondary | ICD-10-CM | POA: Diagnosis present

## 2021-09-29 DIAGNOSIS — G928 Other toxic encephalopathy: Secondary | ICD-10-CM | POA: Diagnosis present

## 2021-09-29 DIAGNOSIS — E512 Wernicke's encephalopathy: Secondary | ICD-10-CM | POA: Diagnosis present

## 2021-09-29 DIAGNOSIS — Z6829 Body mass index (BMI) 29.0-29.9, adult: Secondary | ICD-10-CM

## 2021-09-29 DIAGNOSIS — F411 Generalized anxiety disorder: Secondary | ICD-10-CM | POA: Diagnosis present

## 2021-09-29 DIAGNOSIS — F329 Major depressive disorder, single episode, unspecified: Secondary | ICD-10-CM | POA: Diagnosis present

## 2021-09-29 DIAGNOSIS — W19XXXA Unspecified fall, initial encounter: Secondary | ICD-10-CM | POA: Diagnosis present

## 2021-09-29 DIAGNOSIS — Z23 Encounter for immunization: Secondary | ICD-10-CM

## 2021-09-29 DIAGNOSIS — Z79899 Other long term (current) drug therapy: Secondary | ICD-10-CM

## 2021-09-29 DIAGNOSIS — Z833 Family history of diabetes mellitus: Secondary | ICD-10-CM

## 2021-09-29 DIAGNOSIS — E1165 Type 2 diabetes mellitus with hyperglycemia: Secondary | ICD-10-CM | POA: Diagnosis present

## 2021-09-29 DIAGNOSIS — I959 Hypotension, unspecified: Secondary | ICD-10-CM

## 2021-09-29 DIAGNOSIS — Z20822 Contact with and (suspected) exposure to covid-19: Secondary | ICD-10-CM | POA: Diagnosis present

## 2021-09-29 DIAGNOSIS — M48061 Spinal stenosis, lumbar region without neurogenic claudication: Secondary | ICD-10-CM | POA: Diagnosis present

## 2021-09-29 DIAGNOSIS — I951 Orthostatic hypotension: Principal | ICD-10-CM | POA: Diagnosis present

## 2021-09-29 DIAGNOSIS — R634 Abnormal weight loss: Secondary | ICD-10-CM | POA: Diagnosis present

## 2021-09-29 DIAGNOSIS — K760 Fatty (change of) liver, not elsewhere classified: Secondary | ICD-10-CM | POA: Diagnosis present

## 2021-09-29 DIAGNOSIS — D649 Anemia, unspecified: Secondary | ICD-10-CM | POA: Diagnosis present

## 2021-09-29 DIAGNOSIS — E1140 Type 2 diabetes mellitus with diabetic neuropathy, unspecified: Secondary | ICD-10-CM | POA: Diagnosis present

## 2021-09-29 DIAGNOSIS — G9340 Encephalopathy, unspecified: Secondary | ICD-10-CM | POA: Diagnosis present

## 2021-09-29 DIAGNOSIS — E669 Obesity, unspecified: Secondary | ICD-10-CM | POA: Diagnosis present

## 2021-09-29 DIAGNOSIS — Y92009 Unspecified place in unspecified non-institutional (private) residence as the place of occurrence of the external cause: Secondary | ICD-10-CM

## 2021-09-29 DIAGNOSIS — E872 Acidosis, unspecified: Secondary | ICD-10-CM | POA: Diagnosis present

## 2021-09-29 DIAGNOSIS — T50995A Adverse effect of other drugs, medicaments and biological substances, initial encounter: Secondary | ICD-10-CM | POA: Diagnosis present

## 2021-09-29 DIAGNOSIS — R4781 Slurred speech: Secondary | ICD-10-CM | POA: Diagnosis present

## 2021-09-29 DIAGNOSIS — M21371 Foot drop, right foot: Secondary | ICD-10-CM | POA: Diagnosis present

## 2021-09-29 DIAGNOSIS — F1010 Alcohol abuse, uncomplicated: Secondary | ICD-10-CM | POA: Diagnosis present

## 2021-09-29 DIAGNOSIS — R296 Repeated falls: Secondary | ICD-10-CM | POA: Diagnosis present

## 2021-09-29 DIAGNOSIS — K2270 Barrett's esophagus without dysplasia: Secondary | ICD-10-CM | POA: Diagnosis present

## 2021-09-29 DIAGNOSIS — Z7984 Long term (current) use of oral hypoglycemic drugs: Secondary | ICD-10-CM

## 2021-09-29 DIAGNOSIS — D696 Thrombocytopenia, unspecified: Secondary | ICD-10-CM | POA: Diagnosis present

## 2021-09-29 DIAGNOSIS — Z87891 Personal history of nicotine dependence: Secondary | ICD-10-CM

## 2021-09-29 DIAGNOSIS — R29810 Facial weakness: Secondary | ICD-10-CM | POA: Diagnosis present

## 2021-09-29 DIAGNOSIS — E871 Hypo-osmolality and hyponatremia: Secondary | ICD-10-CM | POA: Diagnosis present

## 2021-09-29 DIAGNOSIS — E785 Hyperlipidemia, unspecified: Secondary | ICD-10-CM | POA: Diagnosis present

## 2021-09-29 DIAGNOSIS — E876 Hypokalemia: Secondary | ICD-10-CM | POA: Diagnosis present

## 2021-09-29 DIAGNOSIS — Z8249 Family history of ischemic heart disease and other diseases of the circulatory system: Secondary | ICD-10-CM

## 2021-09-29 LAB — PROTIME-INR
INR: 1.2 (ref 0.8–1.2)
Prothrombin Time: 14.8 seconds (ref 11.4–15.2)

## 2021-09-29 LAB — CBC WITH DIFFERENTIAL/PLATELET
Abs Immature Granulocytes: 0.03 10*3/uL (ref 0.00–0.07)
Basophils Absolute: 0.1 10*3/uL (ref 0.0–0.1)
Basophils Relative: 1 %
Eosinophils Absolute: 0.4 10*3/uL (ref 0.0–0.5)
Eosinophils Relative: 7 %
HCT: 35.9 % — ABNORMAL LOW (ref 39.0–52.0)
Hemoglobin: 13.1 g/dL (ref 13.0–17.0)
Immature Granulocytes: 1 %
Lymphocytes Relative: 25 %
Lymphs Abs: 1.3 10*3/uL (ref 0.7–4.0)
MCH: 30.8 pg (ref 26.0–34.0)
MCHC: 36.5 g/dL — ABNORMAL HIGH (ref 30.0–36.0)
MCV: 84.5 fL (ref 80.0–100.0)
Monocytes Absolute: 0.5 10*3/uL (ref 0.1–1.0)
Monocytes Relative: 10 %
Neutro Abs: 2.9 10*3/uL (ref 1.7–7.7)
Neutrophils Relative %: 56 %
Platelets: 87 10*3/uL — ABNORMAL LOW (ref 150–400)
RBC: 4.25 MIL/uL (ref 4.22–5.81)
RDW: 12.9 % (ref 11.5–15.5)
WBC: 5.2 10*3/uL (ref 4.0–10.5)
nRBC: 0 % (ref 0.0–0.2)

## 2021-09-29 LAB — RESP PANEL BY RT-PCR (FLU A&B, COVID) ARPGX2
Influenza A by PCR: NEGATIVE
Influenza B by PCR: NEGATIVE
SARS Coronavirus 2 by RT PCR: NEGATIVE

## 2021-09-29 LAB — COMPREHENSIVE METABOLIC PANEL
ALT: 13 U/L (ref 0–44)
AST: 24 U/L (ref 15–41)
Albumin: 3.2 g/dL — ABNORMAL LOW (ref 3.5–5.0)
Alkaline Phosphatase: 151 U/L — ABNORMAL HIGH (ref 38–126)
Anion gap: 10 (ref 5–15)
BUN: 27 mg/dL — ABNORMAL HIGH (ref 8–23)
CO2: 21 mmol/L — ABNORMAL LOW (ref 22–32)
Calcium: 8.8 mg/dL — ABNORMAL LOW (ref 8.9–10.3)
Chloride: 100 mmol/L (ref 98–111)
Creatinine, Ser: 1.76 mg/dL — ABNORMAL HIGH (ref 0.61–1.24)
GFR, Estimated: 43 mL/min — ABNORMAL LOW (ref >60)
Glucose, Bld: 487 mg/dL — ABNORMAL HIGH (ref 70–99)
Potassium: 4.2 mmol/L (ref 3.5–5.1)
Sodium: 131 mmol/L — ABNORMAL LOW (ref 135–145)
Total Bilirubin: 1 mg/dL (ref 0.3–1.2)
Total Protein: 5.7 g/dL — ABNORMAL LOW (ref 6.5–8.1)

## 2021-09-29 LAB — ETHANOL: Alcohol, Ethyl (B): <10 mg/dL (ref <10)

## 2021-09-29 LAB — GLUCOSE, CAPILLARY: Glucose-Capillary: 223 mg/dL — ABNORMAL HIGH (ref 70–99)

## 2021-09-29 LAB — AMMONIA: Ammonia: 65 umol/L — ABNORMAL HIGH (ref 9–35)

## 2021-09-29 LAB — CK: Total CK: 56 U/L (ref 49–397)

## 2021-09-29 LAB — TROPONIN I (HIGH SENSITIVITY)
Troponin I (High Sensitivity): 5 ng/L (ref <18)
Troponin I (High Sensitivity): 5 ng/L (ref <18)

## 2021-09-29 MED ORDER — INSULIN ASPART 100 UNIT/ML IJ SOLN
0.0000 [IU] | Freq: Every day | INTRAMUSCULAR | Status: DC
  Administered 2021-09-29 – 2021-09-30 (×2): 2 [IU] via SUBCUTANEOUS

## 2021-09-29 MED ORDER — ACETAMINOPHEN 650 MG RE SUPP: 650.0000 mg | Freq: Four times a day (QID) | RECTAL | Status: DC | PRN

## 2021-09-29 MED ORDER — ENOXAPARIN SODIUM 40 MG/0.4ML IJ SOSY
40.0000 mg | PREFILLED_SYRINGE | INTRAMUSCULAR | Status: DC
  Administered 2021-09-30 – 2021-10-02 (×3): 40 mg via SUBCUTANEOUS
  Filled 2021-09-29 (×3): qty 0.4

## 2021-09-29 MED ORDER — INSULIN ASPART 100 UNIT/ML IJ SOLN: 0.0000 [IU] | Freq: Three times a day (TID) | INTRAMUSCULAR | Status: DC

## 2021-09-29 MED ORDER — INSULIN ASPART 100 UNIT/ML IJ SOLN
0.0000 [IU] | Freq: Three times a day (TID) | INTRAMUSCULAR | Status: DC
  Administered 2021-09-30: 12:00:00 8 [IU] via SUBCUTANEOUS
  Administered 2021-09-30: 17:00:00 5 [IU] via SUBCUTANEOUS
  Administered 2021-09-30 – 2021-10-01 (×2): 8 [IU] via SUBCUTANEOUS

## 2021-09-29 MED ORDER — SODIUM CHLORIDE 0.9 % IV SOLN
1.0000 mg | Freq: Once | INTRAVENOUS | Status: AC
  Administered 2021-09-30: 01:00:00 1 mg via INTRAVENOUS
  Filled 2021-09-29 (×2): qty 0.2

## 2021-09-29 MED ORDER — DIAZEPAM 2 MG PO TABS
1.0000 mg | ORAL_TABLET | Freq: Once | ORAL | Status: AC
  Administered 2021-09-29: 19:00:00 1 mg via ORAL
  Filled 2021-09-29: qty 1

## 2021-09-29 MED ORDER — ATENOLOL 25 MG PO TABS
25.0000 mg | ORAL_TABLET | Freq: Every day | ORAL | Status: DC
  Administered 2021-09-29 – 2021-10-01 (×3): 25 mg via ORAL
  Filled 2021-09-29 (×3): qty 1

## 2021-09-29 MED ORDER — LACTATED RINGERS IV SOLN: INTRAVENOUS | Status: DC

## 2021-09-29 MED ORDER — THIAMINE HCL 100 MG/ML IJ SOLN
500.0000 mg | Freq: Three times a day (TID) | INTRAVENOUS | Status: DC
  Administered 2021-09-29 – 2021-10-01 (×5): 500 mg via INTRAVENOUS
  Filled 2021-09-29 (×8): qty 5

## 2021-09-29 MED ORDER — ACETAMINOPHEN 325 MG PO TABS
650.0000 mg | ORAL_TABLET | Freq: Four times a day (QID) | ORAL | Status: DC | PRN
  Administered 2021-10-01: 22:00:00 650 mg via ORAL
  Filled 2021-09-29: qty 2

## 2021-09-29 NOTE — ED Notes (Signed)
Prompted urine. Dr Renaye Rakers removed c collar. Pt's son at bedside.

## 2021-09-29 NOTE — Progress Notes (Signed)
Discussed the case with attending Dr. Jennette Kettle, who recommended dosing for Wernicke's encephalopathy given the concern for cirrhosis and the altered mental status changes. Discussed with pharmacy regarding dosing, who recommended Thiamine 500mg  TID x3 days followed by 100mg  afterwards, and normal dosing of folic acid.   Lam Bjorklund, DO

## 2021-09-29 NOTE — ED Triage Notes (Signed)
From home, BIB EMS when son visited and noted pt to be confused and "not himself." Pt has had 4 falls in the alst 2 days. EMS placed c collar d/t pt c/o neck pain and headache. Pressure 90/48 for medic, 500cc bolus given en route

## 2021-09-29 NOTE — ED Notes (Addendum)
Pt resting in bed with son at bedside. Pt a/ox4. Obeys commands. Per pt, has been falling. Pt has abrasion to nose, arms. Pt also has brown dried substance in mouth and on tongue but denies vomiting or bleeding. C/o neck and shoulder pain. Per son, pt was found today around 1130 and states he had left facial droop and more slurred speech than normal. Pt denies headache, dizziness, CP, SOB, ABD pain, n/v/d, fever or recent illness.

## 2021-09-29 NOTE — H&P (Addendum)
Eric Ortega Admission History and Physical Service Pager: [phone]  Patient name: Eric Ortega Medical record number: [id number] Date of birth: 10-22-1960 Age: 60 y.o. Gender: male  Primary Care Provider: Cathlyn Parsons, MD Consultants: None Code Status: Full Code  Preferred Emergency Contact:  Primary Emergency Contact: Eric Ortega [phone]  Chief Complaint: Fall, Weakness, and Altered Mental Status   Assessment and Plan: Eric Ortega is a 60 y.o. male with PMHx of T2DM, HLD, HTN, liver cirrhosis, who presented Fall, Weakness, and Altered Mental Status.  Acute toxic/metabolic encephalopathy Patient presents with increased falls x 2 days (12/23), along with left-sided facial drooping and unilateral weakness noted this morning by son. Patient had similar presentation on 8/30 patient, though per note it was unclear of follow-up or plan.  Patient presented to the ED via EMS and was found to be initially drowsy, A&Ox3, hypotensive with BP 87/57, and found to have dried brown substance and mouth and tongue. On assessment patient is A&O x3, person and place and situation. Patient drowsy, but able to follow commands, and cooperative with exam.  No neurological focal deficit on exam aside from scanning speech, where patient was having difficulties finding his words.  Clinical picture concerning for TIA vs CVA vs hypoperfusion event. Risk factors include h/o T2DM, HLD, EtOH abuse, and remote tobacco use. Considered the following: cardiogenic syncope (given sudden collapse and 3-4 seconds loss of consciousness, without prodromal symptoms), seizure (h/o chronic EtOH abuse and unknown exact last drink, though less likely and patient denied incontinence or seizure-like activity), Wernicke's encephalopathy (frequent falls, h/o intentional tremors, and poor p.o. intake due to decreased appetite), polypharmacy. No signs of infections as is afebrile with no leukocytosis.  Concern for  hepatic encephalopathy given his elevated ammonia 65, lethargy, and h/o liver cirrhosis. CMP showed glc 487, cNa+ 137, bicarb 21, creatinine 1.76, BUN 27, alkaline phos 151. NCHCT (-) and NCCT spine and neck for acute processes, fractures or bleeds.  Patient received MRI brain without contrast, pending review. Patient admitted to progressive with attending Eric La, MD  Vitals per unit routine with pulse ox Frequent neuro checks Continuous cardiac monitoring PT/OT/SLP eval and treat Diet: NPO until SLP eval Fall and delirium precautions CIWA without Ativan Holding antihypertensive to allow for permissive hypertension Follow-up MRI brain wo contrast Follow-up echocardiogram Follow-up UDS, UA, A1c, lipid panel, TSH, CK CBC, CMP AM Orthostatic vital signs tomorrow a.m. Start high-dose thiamine (570m TID x3 days), folate, B12 Start IV LR at 125 cc/h for 8 hours We will consider consulting cardiology or neurology pending results of tests above  AKI Cr (!) 1.76, baseline Cr~0.7-0.8. Likely prerenal given hypotension and poor p.o. intake and possible polypharmacy with several medications affecting the kidneys. Avoid nephrotoxic agents as able Holding antihypertensives per above Trend labs per above IVF per above  Polypharmacy He receives Clonazepam 0.5 mg BID PRN x45 tablets on 09/05/2021. Patient also has several blood pressure medications and med rec is inconsistent as patient has had issues with affording his medications. Holding home sedating meds Follow-up med reconciliation   HTN Home medications that affect blood pressure include Lisinopril 111mdaily, atenolol 10063maily, clonazepam 0.5mg29mD PRN, tamsulosin 0.4mg 68mly. Given patient's AKI and presenting hypotension, we  will hold all home medications other than a lower dose of atenolol as there are adverse effects with abrupt discontinuation. All of patient's home meds are dosed nightly. Hold home lisinopril, clonazepam,  tamsulosin Continue lower dose of atenolol 25mg 47m  HLD Home atorvastatin 40 mg daily, however patient has not taken it in months due to cost. Last lipid panel was 03/08/2021, LDL 99, HDL 55, total cholesterol 187. TOC consult for med assistance Lipid panel  T2DM HbA1c 10.9 (03/08/2021). Home medications include metformin 1030m BID and glimepiride. Given patient's AKI and , we will hold metformin and glimepiride. Will closely monitor  CBGs and address with sliding scale and long-acting insulin as appropriate.  Hold metformin Sensitive SSI This patient is NPO currently, will hold off starting SSI CGBs q4hr  Unintentional weight loss Reported unintentional 65 pound weight loss within the last 7-8 months. Stated that he is just had decreased appetite. This is concerning for malignancy since he has a history of liver cirrhosis 2/2 EtOH and history of tobacco use. We will consider work-up once patient is more stable.  Possible decompensated liver cirrhosis 2/2 chronic EtOH abuse Patient reports recent diagnosis of liver cirrhosis 2/2 chronic EtOH abuse and has had persistent tremors since. Patient has had increased drowsiness, falls, decreased appetite, elevated ammonia 65. Last drink is uncertain, EtOH <<62  He has chronic thrombocytopenia at PLT 80s and INR 1.2. - Continue to monitor with CBC - High dose thiamine treatment - Folate daily  GAD/MDD Home meds are Celexa 40 mg daily, amitriptyline 100 mg daily, clonazepam 0.5 mg PRN.  Hold home clonazepam 0.5 mg BID PRN We will consider restarting Celexa 40 mg daily and amitriptyline 100 mg daily after med rec  FEN/GI: NPO, awaiting bedside swallow for diet Prophylaxis: enoxaparin (LOVENOX) injection 40 mg Start: 09/30/21 1000  Disposition: Progressive   History of Present Illness:   [name] a 60y.o. male presenting with Fall, Weakness, and Altered Mental Status since Friday 12/23.  Over the weekend he fell 4-5 times. He notes  that he has a baseline tremor for several years and that when he is holding objects his hand will jerk and he may drop items. Patient doesn't note any new medications. The day of the first fall (Friday), he was in the house and he was getting up from sitting.  On Saturday day he was trying to go to his car and fell at the edge of the bed. He notes that his knees are hurting because of the falls. He does not note consistent triggers though he does note that he has felt dizzy prior to falling. Some of the falls have been associated with getting up. He states that his mother saw him fall and he had no LOC. He notes that he hit the side of his face and his nose during a fall. Started having weakness during his fall.   Has to sit down to pee or he will fall. He has had 3 times where he was having an increased odor and darkness to his urine.  States that 3-4 months ago he had a fall as well [place] [employer] coming out [place] the store and hit his head then. He states that he didn't go to the doctor for that fall.  Notes he had LOC when he fell when his brother was there and about 4-5 seconds later he came to after calling his name.   Quit taking pravastatin about 4 weeks ago because he couldn't pay for it. It was still too expensive with [facility].   Feels that his speech has been slurred for the last 2 days. He sometimes has difficulty with saying the answers to questions when he has the answer  Denies drinking,  smoking, illicit drug use, supplements. Used to smoke about 27 years ago after smoking intermittently for 2-3 years. Used to drink beer, quit 4 weeks ago. Was drinking about . Denies history of withdrawal seizures.  Review Of Systems:  Per HPI with the following additions: Review of Systems  Constitutional:  Positive for appetite change and unexpected weight change (80lbs weight loss over year). Negative for chills and fever.  HENT:  Positive for dental problem. Negative for congestion and sore  throat.   Eyes:  Negative for pain and discharge.  Respiratory:  Negative for cough, shortness of breath and wheezing.   Cardiovascular:  Negative for chest pain and palpitations.  Gastrointestinal:  Negative for abdominal pain, blood in stool, constipation, diarrhea, nausea and vomiting.  Genitourinary:  Negative for decreased urine volume, difficulty urinating, dysuria and urgency.  Musculoskeletal:  Positive for gait problem and myalgias. Negative for joint swelling.  Neurological:  Positive for dizziness, tremors (baseline), syncope, speech difficulty, weakness, light-headedness and headaches. Negative for seizures.    Patient Active Problem List   Diagnosis Date Noted   Acute encephalopathy 09/29/2021   AKI (acute kidney injury) ([place])    Confusion    Hypotension    Hyperglycemia due to diabetes mellitus ([place]) 10/23/2020    Past Medical History: Past Medical History:  Diagnosis Date   Diabetes mellitus without complication ([place])    Hyperlipidemia    Hypertension     Past Surgical History: Past Surgical History:  Procedure Laterality Date   APPENDECTOMY      Social History: Social History   Tobacco Use   Smoking status: Former   Smokeless tobacco: Never  Substance Use Topics   Alcohol use: No   Drug use: No   Please also refer to relevant sections of EMR.  Family History: Family History  Problem Relation Age of Onset   Diabetes Mother    Heart failure Mother    Heart failure Father     Allergies and Medications: No Known Allergies No current facility-administered medications on file prior to encounter.   Current Outpatient Medications on File Prior to Encounter  Medication Sig Dispense Refill   allopurinol (ZYLOPRIM) 100 MG tablet Take 100 mg by mouth daily.     amitriptyline (ELAVIL) 150 MG tablet Take 150 mg by mouth at bedtime.     atenolol (TENORMIN) 100 MG tablet Take 100 mg by mouth every evening.      Blood Glucose Monitoring Suppl (ONETOUCH  VERIO) w/Device KIT 1 each by Does not apply route as needed.     citalopram (CELEXA) 40 MG tablet Take 40 mg by mouth daily.     clonazePAM (KLONOPIN) 0.5 MG tablet Take 0.5 mg by mouth 2 (two) times daily as needed for anxiety.     glimepiride (AMARYL) 2 MG tablet Take 2 mg by mouth daily with breakfast.     HYDROcodone-acetaminophen (NORCO/VICODIN) 5-325 MG tablet Take 1 tablet by mouth every 6 (six) hours as needed for severe pain (breakthrough pain). 6 tablet 0   ibuprofen (ADVIL,MOTRIN) 200 MG tablet Take 600 mg by mouth every 6 (six) hours as needed for headache or mild pain.     lisinopril (PRINIVIL,ZESTRIL) 10 MG tablet Take 10 mg by mouth daily.     meloxicam (MOBIC) 7.5 MG tablet Take 1 tablet (7.5 mg total) by mouth daily. 10 tablet 0   metFORMIN (GLUCOPHAGE) 1000 MG tablet Take 1,000 mg by mouth 2 (two) times daily with a meal.     methocarbamol (  ROBAXIN) 500 MG tablet Take 1 tablet (500 mg total) by mouth 2 (two) times daily as needed for muscle spasms. 10 tablet 0   mupirocin ointment (BACTROBAN) 2 % Apply 1 application topically 3 (three) times daily. 22 g 0   ondansetron (ZOFRAN) 4 MG tablet Take 1 tablet (4 mg total) by mouth every 6 (six) hours as needed for nausea or vomiting. 12 tablet 0   ONETOUCH DELICA LANCETS 81X MISC 1 each by Does not apply route as needed.     oxyCODONE-acetaminophen (PERCOCET) 5-325 MG tablet Take 1-2 tablets by mouth every 6 (six) hours as needed for severe pain. 10 tablet 0   pravastatin (PRAVACHOL) 40 MG tablet Take 40 mg by mouth daily.     predniSONE (STERAPRED UNI-PAK) 10 MG tablet Start taking Friday December 4th.  Take as directed for 6 days. 21 tablet 0   tamsulosin (FLOMAX) 0.4 MG CAPS capsule Take 1 capsule (0.4 mg total) by mouth daily. 10 capsule 0   traZODone (DESYREL) 100 MG tablet Take 100 mg by mouth at bedtime.      Objective: BP 107/80    Pulse 62    Temp 98.2 F (36.8 C) (Oral)    Resp 19    Ht 6' (1.829 m)    Wt 98.4 kg    SpO2  100%    BMI 29.43 kg/m  Exam: Physical Exam Vitals and nursing note reviewed.  Constitutional:      General: He is awake. He is not in acute distress.    Appearance: He is obese. He is ill-appearing. He is not toxic-appearing or diaphoretic.  HENT:     Head: Abrasion present. No contusion.     Comments: Abrasion on bridge of nose from falling    Mouth/Throat:     Mouth: No lacerations.     Comments: Dark brown substance on in her lips and tongue Eyes:     Extraocular Movements: Extraocular movements intact.     Pupils: Pupils are equal, round, and reactive to light.     Comments: Xanthomata on inner corner of eye  Cardiovascular:     Rate and Rhythm: Normal rate and regular rhythm.     Heart sounds: Heart sounds not distant. No murmur heard.   No friction rub. No gallop.  Pulmonary:     Effort: Pulmonary effort is normal. No respiratory distress.     Breath sounds: Normal breath sounds. No decreased breath sounds, wheezing, rhonchi or rales.  Abdominal:     General: Abdomen is protuberant. Bowel sounds are normal. There is no distension.     Palpations: Abdomen is soft.     Tenderness: There is no abdominal tenderness. There is no guarding or rebound.  Musculoskeletal:     Right lower leg: No edema.     Left lower leg: No edema.  Skin:    General: Skin is warm and dry.  Neurological:     General: No focal deficit present.     Mental Status: He is oriented to person, place, and time and easily aroused. He is lethargic.     Cranial Nerves: Dysarthria present. No cranial nerve deficit or facial asymmetry.     Sensory: Sensation is intact.     Motor: Motor function is intact. No weakness, tremor, abnormal muscle tone, seizure activity or pronator drift.     Coordination: Coordination abnormal. Finger-Nose-Finger Test abnormal. Heel to Shin Test normal.  Psychiatric:        Behavior: Behavior is cooperative.  Labs and Imaging: CBC BMET  Recent Labs  Lab 09/29/21 1305   WBC 5.2  HGB 13.1  HCT 35.9*  PLT 87*   Recent Labs  Lab 09/29/21 1305  NA 131*  K 4.2  CL 100  CO2 21*  BUN 27*  CREATININE 1.76*  GLUCOSE 487*  CALCIUM 8.8*     EKG: NSR, no ischemic changes     CT HEAD WO CONTRAST (5MM)  Result Date: 09/29/2021 CLINICAL DATA:  Multiple falls EXAM: CT HEAD WITHOUT CONTRAST CT CERVICAL SPINE WITHOUT CONTRAST TECHNIQUE: Multidetector CT imaging of the head and cervical spine was performed following the standard protocol without intravenous contrast. Multiplanar CT image reconstructions of the cervical spine were also generated. COMPARISON:  11/07/2020 FINDINGS: CT HEAD FINDINGS Brain: No evidence of acute infarction, hemorrhage, hydrocephalus, extra-axial collection or mass lesion/mass effect. Vascular: No hyperdense vessel or unexpected calcification. Skull: Normal. Negative for fracture or focal lesion. Sinuses/Orbits: No acute finding. Other: None. CT CERVICAL SPINE FINDINGS Alignment: Normal. Skull base and vertebrae: No acute fracture. No primary bone lesion or focal pathologic process. Soft tissues and spinal canal: No prevertebral fluid or swelling. No visible canal hematoma. Disc levels: Mild to moderate multilevel disc space height loss and osteophytosis of the lower cervical levels. Upper chest: Negative. Other: None. IMPRESSION: 1. No acute intracranial pathology. 2. No fracture or static subluxation of the cervical spine. 3. Mild to moderate multilevel cervical disc degenerative disease. Electronically Signed   By: Delanna Ahmadi M.D.   On: 09/29/2021 13:43   CT Cervical Spine Wo Contrast  Result Date: 09/29/2021 CLINICAL DATA:  Multiple falls EXAM: CT HEAD WITHOUT CONTRAST CT CERVICAL SPINE WITHOUT CONTRAST TECHNIQUE: Multidetector CT imaging of the head and cervical spine was performed following the standard protocol without intravenous contrast. Multiplanar CT image reconstructions of the cervical spine were also generated. COMPARISON:   11/07/2020 FINDINGS: CT HEAD FINDINGS Brain: No evidence of acute infarction, hemorrhage, hydrocephalus, extra-axial collection or mass lesion/mass effect. Vascular: No hyperdense vessel or unexpected calcification. Skull: Normal. Negative for fracture or focal lesion. Sinuses/Orbits: No acute finding. Other: None. CT CERVICAL SPINE FINDINGS Alignment: Normal. Skull base and vertebrae: No acute fracture. No primary bone lesion or focal pathologic process. Soft tissues and spinal canal: No prevertebral fluid or swelling. No visible canal hematoma. Disc levels: Mild to moderate multilevel disc space height loss and osteophytosis of the lower cervical levels. Upper chest: Negative. Other: None. IMPRESSION: 1. No acute intracranial pathology. 2. No fracture or static subluxation of the cervical spine. 3. Mild to moderate multilevel cervical disc degenerative disease. Electronically Signed   By: Delanna Ahmadi M.D.   On: 09/29/2021 13:43     Merrily Brittle, DO 09/29/2021, 7:25 PM PGY-1, [place] Intern pager: [phone], text pages welcome      FPTS Upper-Level Resident Addendum   I have independently interviewed and examined the patient. I have discussed the above with the original author and agree with their documentation. My edits for correction/addition/clarification are in within the document. Please see also any attending notes.   Rise Patience, DO  PGY-2, [facility] 09/29/2021 7:54 PM  [place] Service pager: [phone] (text pages welcome through [facility])

## 2021-09-29 NOTE — ED Notes (Signed)
Admitting provider at bedside.

## 2021-09-29 NOTE — ED Provider Notes (Signed)
[facility] EMERGENCY DEPARTMENT Provider Note   CSN: [id number] Arrival date & time: 09/29/21  1259     History CC: Confusion, low blood pressure   Eric Ortega is a 60 y.o. male w/ hx HTN, Diabetes, HLD, presenting to the ED with confusion, weakness.  Hx of cirrhosis per EMS.  Patient son reported patient falling frequently the past several days and seems weak and unsteady on his feet.  The patient ports last drink was 4 months ago.  He fell today and struck his face on the ground.  He is complaining of headache and neck pain.  He is in a C-spine collar.    Son at the bedside reports that the patient lives with the patient's father.  The patient's been having chronic issues with hypotension, lightheadedness, occasional ataxia for several months.  It seems to have really worsened in the past 2 days.  HPI     Past Medical History:  Diagnosis Date   Diabetes mellitus without complication ([place])    Hyperlipidemia    Hypertension     Patient Active Problem List   Diagnosis Date Noted   Acute encephalopathy 09/29/2021   AKI (acute kidney injury) ([place])    Confusion    Hypotension    Hyperglycemia due to diabetes mellitus ([place]) 10/23/2020    Past Surgical History:  Procedure Laterality Date   APPENDECTOMY         Family History  Problem Relation Age of Onset   Diabetes Mother    Heart failure Mother    Heart failure Father     Social History   Tobacco Use   Smoking status: Former   Smokeless tobacco: Never  Substance Use Topics   Alcohol use: No   Drug use: No    Home Medications Prior to Admission medications   Medication Sig Start Date End Date Taking? Authorizing Provider  allopurinol (ZYLOPRIM) 100 MG tablet Take 100 mg by mouth daily.    [provider]  amitriptyline (ELAVIL) 150 MG tablet Take 150 mg by mouth at bedtime.    [provider]  atenolol (TENORMIN) 100 MG tablet Take 100 mg by mouth every evening.      [provider]  Blood Glucose Monitoring Suppl (ONETOUCH VERIO) w/Device KIT 1 each by Does not apply route as needed. 04/03/14   [provider]  citalopram (CELEXA) 40 MG tablet Take 40 mg by mouth daily.    [provider]  clonazePAM (KLONOPIN) 0.5 MG tablet Take 0.5 mg by mouth 2 (two) times daily as needed for anxiety.    [provider]  glimepiride (AMARYL) 2 MG tablet Take 2 mg by mouth daily with breakfast.    [provider]  HYDROcodone-acetaminophen (NORCO/VICODIN) 5-325 MG tablet Take 1 tablet by mouth every 6 (six) hours as needed for severe pain (breakthrough pain). 09/21/19   Robinson, [place] N, PA-C  ibuprofen (ADVIL,MOTRIN) 200 MG tablet Take 600 mg by mouth every 6 (six) hours as needed for headache or mild pain.    [provider]  lisinopril (PRINIVIL,ZESTRIL) 10 MG tablet Take 10 mg by mouth daily. 12/02/16   [provider]  meloxicam (MOBIC) 7.5 MG tablet Take 1 tablet (7.5 mg total) by mouth daily. 01/06/18   Lorin Glass, PA-C  metFORMIN (GLUCOPHAGE) 1000 MG tablet Take 1,000 mg by mouth 2 (two) times daily with a meal.    [provider]  methocarbamol (ROBAXIN) 500 MG tablet Take 1 tablet (500  mg total) by mouth 2 (two) times daily as needed for muscle spasms. 10/19/20   Walisiewicz, Harley Hallmark, PA-C  mupirocin ointment (BACTROBAN) 2 % Apply 1 application topically 3 (three) times daily. 06/07/14   Kandra Nicolas, MD  ondansetron (ZOFRAN) 4 MG tablet Take 1 tablet (4 mg total) by mouth every 6 (six) hours as needed for nausea or vomiting. 06/18/17   Julianne Rice, MD  [facility] DELICA LANCETS 16F MISC 1 each by Does not apply route as needed. 04/03/14   [provider]  oxyCODONE-acetaminophen (PERCOCET) 5-325 MG tablet Take 1-2 tablets by mouth every 6 (six) hours as needed for severe pain. 06/18/17   Julianne Rice, MD  pravastatin (PRAVACHOL) 40 MG tablet Take 40 mg by mouth daily.     [provider]  predniSONE (STERAPRED UNI-PAK) 10 MG tablet Start taking Friday December 4th.  Take as directed for 6 days. 09/07/14   Jacqulyn Cane, MD  tamsulosin (FLOMAX) 0.4 MG CAPS capsule Take 1 capsule (0.4 mg total) by mouth daily. 06/18/17   Julianne Rice, MD  traZODone (DESYREL) 100 MG tablet Take 100 mg by mouth at bedtime. 07/04/16 07/04/17  [provider]    Allergies    Patient has no known allergies.  Review of Systems   Review of Systems  Constitutional:  Positive for fatigue. Negative for chills and fever.  HENT:  Negative for ear pain and sore throat.   Eyes:  Negative for pain and visual disturbance.  Respiratory:  Negative for cough and shortness of breath.   Cardiovascular:  Negative for chest pain and palpitations.  Gastrointestinal:  Negative for abdominal pain and vomiting.  Genitourinary:  Negative for dysuria and hematuria.  Musculoskeletal:  Negative for arthralgias and myalgias.  Skin:  Negative for color change and rash.  Neurological:  Positive for light-headedness. Negative for syncope and headaches.  All other systems reviewed and are negative.  Physical Exam Updated Vital Signs BP 107/80    Pulse 62    Temp 98.2 F (36.8 C) (Oral)    Resp 19    Ht 6' (1.829 m)    Wt 98.4 kg    SpO2 100%    BMI 29.43 kg/m   Physical Exam Constitutional:      General: He is not in acute distress. HENT:     Head: Normocephalic and atraumatic.  Eyes:     Conjunctiva/sclera: Conjunctivae normal.     Pupils: Pupils are equal, round, and reactive to light.  Cardiovascular:     Rate and Rhythm: Normal rate and regular rhythm.     Pulses: Normal pulses.  Pulmonary:     Effort: Pulmonary effort is normal. No respiratory distress.     Breath sounds: Normal breath sounds.  Abdominal:     General: There is no distension.     Tenderness: There is no abdominal tenderness.  Skin:    General: Skin is warm and dry.  Neurological:     General: No focal  deficit present.     Mental Status: He is alert and oriented to person, place, and time. Mental status is at baseline.    ED Results / Procedures / Treatments   Labs (all labs ordered are listed, but only abnormal results are displayed) Labs Reviewed  COMPREHENSIVE METABOLIC PANEL - Abnormal; Notable for the following components:      Result Value   Sodium 131 (*)    CO2 21 (*)    Glucose, Bld 487 (*)  BUN 27 (*)    Creatinine, Ser 1.76 (*)    Calcium 8.8 (*)    Total Protein 5.7 (*)    Albumin 3.2 (*)    Alkaline Phosphatase 151 (*)    GFR, Estimated 43 (*)    All other components within normal limits  CBC WITH DIFFERENTIAL/PLATELET - Abnormal; Notable for the following components:   HCT 35.9 (*)    MCHC 36.5 (*)    Platelets 87 (*)    All other components within normal limits  AMMONIA - Abnormal; Notable for the following components:   Ammonia 65 (*)    All other components within normal limits  RESP PANEL BY RT-PCR (FLU A&B, COVID) ARPGX2  PROTIME-INR  ETHANOL  URINALYSIS, ROUTINE W REFLEX MICROSCOPIC  RAPID URINE DRUG SCREEN, HOSP PERFORMED  HIV ANTIBODY (ROUTINE TESTING W REFLEX)  TSH  COMPREHENSIVE METABOLIC PANEL  CBC  CK  VITAMIN B12  FOLATE  VITAMIN B1  HEMOGLOBIN A1C  LIPID PANEL  TROPONIN I (HIGH SENSITIVITY)  TROPONIN I (HIGH SENSITIVITY)    EKG EKG Interpretation  Date/Time:  Sunday September 29 2021 13:09:42 EST Ventricular Rate:  68 PR Interval:  194 QRS Duration: 110 QT Interval:  439 QTC Calculation: 467 R Axis:   -27 Text Interpretation: Sinus rhythm Borderline left axis deviation Low voltage, precordial leads Abnormal R-wave progression, late transition Confirmed by Octaviano Glow [phone]) on 09/29/2021 1:19:18 PM  Radiology CT HEAD WO CONTRAST (5MM)  Result Date: 09/29/2021 CLINICAL DATA:  Multiple falls EXAM: CT HEAD WITHOUT CONTRAST CT CERVICAL SPINE WITHOUT CONTRAST TECHNIQUE: Multidetector CT imaging of the head and cervical  spine was performed following the standard protocol without intravenous contrast. Multiplanar CT image reconstructions of the cervical spine were also generated. COMPARISON:  11/07/2020 FINDINGS: CT HEAD FINDINGS Brain: No evidence of acute infarction, hemorrhage, hydrocephalus, extra-axial collection or mass lesion/mass effect. Vascular: No hyperdense vessel or unexpected calcification. Skull: Normal. Negative for fracture or focal lesion. Sinuses/Orbits: No acute finding. Other: None. CT CERVICAL SPINE FINDINGS Alignment: Normal. Skull base and vertebrae: No acute fracture. No primary bone lesion or focal pathologic process. Soft tissues and spinal canal: No prevertebral fluid or swelling. No visible canal hematoma. Disc levels: Mild to moderate multilevel disc space height loss and osteophytosis of the lower cervical levels. Upper chest: Negative. Other: None. IMPRESSION: 1. No acute intracranial pathology. 2. No fracture or static subluxation of the cervical spine. 3. Mild to moderate multilevel cervical disc degenerative disease. Electronically Signed   By: Delanna Ahmadi M.D.   On: 09/29/2021 13:43   CT Cervical Spine Wo Contrast  Result Date: 09/29/2021 CLINICAL DATA:  Multiple falls EXAM: CT HEAD WITHOUT CONTRAST CT CERVICAL SPINE WITHOUT CONTRAST TECHNIQUE: Multidetector CT imaging of the head and cervical spine was performed following the standard protocol without intravenous contrast. Multiplanar CT image reconstructions of the cervical spine were also generated. COMPARISON:  11/07/2020 FINDINGS: CT HEAD FINDINGS Brain: No evidence of acute infarction, hemorrhage, hydrocephalus, extra-axial collection or mass lesion/mass effect. Vascular: No hyperdense vessel or unexpected calcification. Skull: Normal. Negative for fracture or focal lesion. Sinuses/Orbits: No acute finding. Other: None. CT CERVICAL SPINE FINDINGS Alignment: Normal. Skull base and vertebrae: No acute fracture. No primary bone lesion or  focal pathologic process. Soft tissues and spinal canal: No prevertebral fluid or swelling. No visible canal hematoma. Disc levels: Mild to moderate multilevel disc space height loss and osteophytosis of the lower cervical levels. Upper chest: Negative. Other: None. IMPRESSION: 1. No acute intracranial pathology.  2. No fracture or static subluxation of the cervical spine. 3. Mild to moderate multilevel cervical disc degenerative disease. Electronically Signed   By: Delanna Ahmadi M.D.   On: 09/29/2021 13:43    Procedures Procedures   Medications Ordered in ED Medications  enoxaparin (LOVENOX) injection 40 mg (has no administration in time range)  acetaminophen (TYLENOL) tablet 650 mg (has no administration in time range)    Or  acetaminophen (TYLENOL) suppository 650 mg (has no administration in time range)  thiamine 59m in normal saline (558m IVPB (has no administration in time range)  folic acid 1 mg in sodium chloride 0.9 % 50 mL IVPB (has no administration in time range)  atenolol (TENORMIN) tablet 25 mg (has no administration in time range)  insulin aspart (novoLOG) injection 0-9 Units (has no administration in time range)  lactated ringers infusion (has no administration in time range)  diazepam (VALIUM) tablet 1 mg (1 mg Oral Given 09/29/21 1910)    ED Course  I have reviewed the triage vital signs and the nursing notes.  Pertinent labs & imaging results that were available during my care of the patient were reviewed by me and considered in my medical decision making (see chart for details).  This patient presents to the Emergency Department with complaint of altered mental status.  This involves an extensive number of treatment options, and is a complaint that carries with it a high risk of complications and morbidity.  The differential diagnosis includes hypoglycemia vs metabolic encephalopathy vs infection (including cystitis) vs ICH vs stroke vs polypharmacy vs other  I  ordered, reviewed, and interpreted labs, including normal troponin, elevated ammonia 65.  Likely prerenal AKI with elevated BUN and creatinine at 1.76.  Albumin slightly low at 3.2.  COVID and flu are negative.  No acute anemia.  White blood cell count is normal. I ordered medication IV fluids for hypotension I ordered imaging studies which included CT the scan of the brain and cervical spine I independently visualized and interpreted imaging which showed no acute traumatic injuries or evidence of CVA acutely, and the monitor tracing which showed NSR Additional history was obtained from patient son at bedside  Clinical Course as of 09/29/21 2023  Sun Sep 29, 2021  1338 BP soft here, pt received 500 cc fluid by EMS, will hang 1 L here, he does appear to be on multiple HTN medications and was noted to have orthostatic hypotension on his office visit in August with his PCP, [MT]  1427 BUN(!): 27 [MT]  1427 Creatinine(!): 1.76 [MT]  1554 Will admit patient for AKI, hypotension, elevated ammonia, hypergylcemia.  No evidence of DKA or sepsis at this time.  I doubt STEMI or pulmonary embolism or ACS.  Patient and son updated at bedside.  They are in agreement for admission for IV fluids, kidney function recheck, blood pressure monitoring, and I would recommend also reconciliation of blood pressure medicines [MT]  1615 Admitted, signout given to inpatient care team [MT]    Clinical Course User Index [MT] Sofie Schendel, MaCarola RhineMD    Final Clinical Impression(s) / ED Diagnoses Final diagnoses:  [place]ot[place], unspecified hypotension type  Confusion    Rx / DC Orders ED Discharge Orders     None        TrWyvonnia DuskyMD 09/29/21 2023

## 2021-09-29 NOTE — Hospital Course (Addendum)
Eric Ortega is a 60 y.o. male that presented with recurrent falls, weakness and AMS. He has a PMHx of T2DM, HLD, HTN, liver cirrhosis. Below is his hospital course listed by problem, refer to the H&P for additional information.   Acute Encephalopathy  Recurrent Falls  Hypotensive on presentation to 87/57 but awake, alert and oriented. Initial neurological examination notable for scanning speech and word finding difficulties. Initial labs notable for ammonia 65. Non-contrast head CT and cervical spine CT negative for acute findings. MRI brain also negative. 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  PT evaluated the patient and performed orthostatic vital signs which showed a significant drop in the patient's blood pressures when going from sitting to standing.  Feel that this acute encephalopathy may have been related to possible hypoperfusion event.  Neurology was consulted who did recommend an MRI spine which showed L4-L5 and L5-S1 narrowing (which were stable from prior scans).  They did recommend neurosurgery consult outpatient.  Patient continue to work with physical therapy who recommended bracing for foot drop and outpatient PT.    Nonoliguric Pre-renal AKI  Creatinine 1.76 on admission (baseline 0.7-0.8). Improved with fluid administration. Creatinine on discharge 0.97  HTN Presented with hypotension 87/57. Home atenolol 100 mg was reduced to 25 mg on admission to avoid rebound tachycardia with discontinuance. Given AKI, Lisinopril was held. Blood pressures stabilized.  Patient's atenolol was continued at 25 mg at discharge.  Nephrology was consulted who provided recommendations.  Patient's blood pressures normalized and or even slightly elevated prior to discharge.  He was discharged on 25 mg atenolol daily.  Nephrology recommended against restarting chlorthalidone but encouraged evaluation by PCP and possibly restarting ACE inhibitor at  low-dose and titrating up.  Hyperglycemia   T2DM Glucose on BMP was 487 in ED. Hgb A1c 11.3. Home medications included metformin 1000mg  BID and glimepiride. Given patient's AKI on arrival, metformin and glimepiride were held. He was started on SSI.    Thrombocytopenia  Platelets 87k on admission. Baseline appears to be around 84k.   GAD/MDD Home medications include Celexa 40 mg daily, amitriptyline 100 mg daily, clonazepam 0.5 mg as needed. All medications were held on admission.  The Celexa was restarted at discharge but the amitriptyline and the clonazepam were discontinued.  Patient reports that he has had issues titrating off of the amitriptyline in the past but is willing to try again.  He will work with his primary care provider on this.  Unintentional Weight Loss  Reported 65 lbs of unintentional weight loss in 7-8 months.   Follow Up Issues:   Ensure up to date on age-appropriate cancer screening. History of unintentional weight loss.  Chronic thrombocytopenia Patient was discharged without restarting ACE inhibitor.  Evaluate blood pressures in the outpatient setting and continue restarting at a low dose Amitriptyline was discontinued during hospitalization.  Patient reports he feels fatigued from the discontinuation but is willing to continue at this time.  Reassess in the outpatient setting. 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  Order placed to [facility] clinic so ensure patient has gotten this prosthesis

## 2021-09-29 NOTE — ED Notes (Signed)
Pt oriented on arrival but hypotensive, updated MD

## 2021-09-29 NOTE — ED Notes (Signed)
Pt transported to MRI 

## 2021-09-29 NOTE — Progress Notes (Signed)
FPTS Brief Progress Note  S: Mr. Mazon tells me about his multiple falls. States most recent fall was around 930 this morning. He estimates 7 falls in the last week. He states he had a similar episode of recurrent falls in January but "they never figured out why". There was a time period where he seemed improved and was not falling as frequently. He otherwise complains of diffuse muscles aches.  O: BP 111/66 (BP Location: Right Arm)    Pulse 64    Temp 98 F (36.7 C) (Oral)    Resp 13    Ht 6' (1.829 m)    Wt 94.5 kg    SpO2 100%    BMI 28.26 kg/m   Gen: Awake, alert, in no distress Resp: CTAB Cardio: RRR  Ext: no edema Neuro: No focal weakness, speech is clear, able to answer all questions appropriately and follows commands.   A/P: Acute Encephalopathy and Recurrent Falls of Unkown Origin Added RPR. MRI returned negative, no concern for CVA.  -F/u labs per H&P -F/u UDS, UA once patient able to urinate  -Remainder of plan per H&P  - Orders reviewed. Labs for AM ordered, which was adjusted as needed.   Sabino Dick, DO 09/29/2021, 11:15 PM PGY-2, [facility] Night Resident  Please page [phone] with questions.

## 2021-09-30 ENCOUNTER — Observation Stay (HOSPITAL_COMMUNITY): Payer: Self-pay

## 2021-09-30 ENCOUNTER — Inpatient Hospital Stay (HOSPITAL_COMMUNITY): Payer: Self-pay

## 2021-09-30 DIAGNOSIS — E1165 Type 2 diabetes mellitus with hyperglycemia: Secondary | ICD-10-CM

## 2021-09-30 DIAGNOSIS — R55 Syncope and collapse: Secondary | ICD-10-CM

## 2021-09-30 LAB — URINALYSIS, ROUTINE W REFLEX MICROSCOPIC
Bilirubin Urine: NEGATIVE
Glucose, UA: >=500 mg/dL — AB
Hgb urine dipstick: NEGATIVE
Ketones, ur: NEGATIVE mg/dL
Leukocytes,Ua: NEGATIVE
Nitrite: NEGATIVE
Protein, ur: NEGATIVE mg/dL
Specific Gravity, Urine: 1.015 (ref 1.005–1.030)
pH: 5.5 (ref 5.0–8.0)

## 2021-09-30 LAB — ECHOCARDIOGRAM COMPLETE
AR max vel: 2.7 cm2
AV Area VTI: 2.71 cm2
AV Area mean vel: 2.66 cm2
AV Mean grad: 4 mmHg
AV Peak grad: 8.1 mmHg
Ao pk vel: 1.42 m/s
Area-P 1/2: 3.48 cm2
Calc EF: 55.8 %
Height: 72 in
Single Plane A2C EF: 57.2 %
Single Plane A4C EF: 57.9 %
Weight: 3333.36 oz

## 2021-09-30 LAB — LIPID PANEL
Cholesterol: 163 mg/dL (ref 0–200)
HDL: 48 mg/dL (ref >40)
LDL Cholesterol: 85 mg/dL (ref 0–99)
Total CHOL/HDL Ratio: 3.4 RATIO
Triglycerides: 148 mg/dL (ref <150)
VLDL: 30 mg/dL (ref 0–40)

## 2021-09-30 LAB — COMPREHENSIVE METABOLIC PANEL
ALT: 15 U/L (ref 0–44)
AST: 19 U/L (ref 15–41)
Albumin: 3.2 g/dL — ABNORMAL LOW (ref 3.5–5.0)
Alkaline Phosphatase: 101 U/L (ref 38–126)
Anion gap: 9 (ref 5–15)
BUN: 28 mg/dL — ABNORMAL HIGH (ref 8–23)
CO2: 22 mmol/L (ref 22–32)
Calcium: 8.8 mg/dL — ABNORMAL LOW (ref 8.9–10.3)
Chloride: 102 mmol/L (ref 98–111)
Creatinine, Ser: 1.74 mg/dL — ABNORMAL HIGH (ref 0.61–1.24)
GFR, Estimated: 44 mL/min — ABNORMAL LOW (ref >60)
Glucose, Bld: 345 mg/dL — ABNORMAL HIGH (ref 70–99)
Potassium: 3.4 mmol/L — ABNORMAL LOW (ref 3.5–5.1)
Sodium: 133 mmol/L — ABNORMAL LOW (ref 135–145)
Total Bilirubin: 1.1 mg/dL (ref 0.3–1.2)
Total Protein: 5.8 g/dL — ABNORMAL LOW (ref 6.5–8.1)

## 2021-09-30 LAB — HIV ANTIBODY (ROUTINE TESTING W REFLEX): HIV Screen 4th Generation wRfx: NONREACTIVE

## 2021-09-30 LAB — GLUCOSE, CAPILLARY
Glucose-Capillary: 287 mg/dL — ABNORMAL HIGH (ref 70–99)
Glucose-Capillary: 274 mg/dL — ABNORMAL HIGH (ref 70–99)
Glucose-Capillary: 232 mg/dL — ABNORMAL HIGH (ref 70–99)
Glucose-Capillary: 221 mg/dL — ABNORMAL HIGH (ref 70–99)

## 2021-09-30 LAB — URINALYSIS, MICROSCOPIC (REFLEX)

## 2021-09-30 LAB — CBC
HCT: 34.9 % — ABNORMAL LOW (ref 39.0–52.0)
Hemoglobin: 12.7 g/dL — ABNORMAL LOW (ref 13.0–17.0)
MCH: 30.1 pg (ref 26.0–34.0)
MCHC: 36.4 g/dL — ABNORMAL HIGH (ref 30.0–36.0)
MCV: 82.7 fL (ref 80.0–100.0)
Platelets: 78 10*3/uL — ABNORMAL LOW (ref 150–400)
RBC: 4.22 MIL/uL (ref 4.22–5.81)
RDW: 13.1 % (ref 11.5–15.5)
WBC: 5.1 10*3/uL (ref 4.0–10.5)
nRBC: 0 % (ref 0.0–0.2)

## 2021-09-30 LAB — FOLATE: Folate: 21.4 ng/mL (ref >5.9)

## 2021-09-30 LAB — RAPID URINE DRUG SCREEN, HOSP PERFORMED
Amphetamines: NOT DETECTED
Barbiturates: NOT DETECTED
Benzodiazepines: NOT DETECTED
Cocaine: NOT DETECTED
Opiates: NOT DETECTED
Tetrahydrocannabinol: NOT DETECTED

## 2021-09-30 LAB — HEMOGLOBIN A1C
Hgb A1c MFr Bld: 11.3 % — ABNORMAL HIGH (ref 4.8–5.6)
Mean Plasma Glucose: 277.61 mg/dL

## 2021-09-30 LAB — TSH: TSH: 0.878 u[IU]/mL (ref 0.350–4.500)

## 2021-09-30 LAB — CK: Total CK: 50 U/L (ref 49–397)

## 2021-09-30 LAB — VITAMIN B12: Vitamin B-12: 518 pg/mL (ref 180–914)

## 2021-09-30 LAB — RPR: RPR Ser Ql: NONREACTIVE

## 2021-09-30 MED ORDER — LIVING WELL WITH DIABETES BOOK
Freq: Once | Status: AC
  Filled 2021-09-30: qty 1

## 2021-09-30 MED ORDER — GADOBUTROL 1 MMOL/ML IV SOLN
8.5000 mL | Freq: Once | INTRAVENOUS | Status: AC | PRN
  Administered 2021-09-30: 21:00:00 8.5 mL via INTRAVENOUS

## 2021-09-30 MED ORDER — ADULT MULTIVITAMIN W/MINERALS CH
1.0000 | ORAL_TABLET | Freq: Every day | ORAL | Status: DC
  Administered 2021-09-30 – 2021-10-02 (×3): 1 via ORAL
  Filled 2021-09-30 (×3): qty 1

## 2021-09-30 MED ORDER — POTASSIUM CHLORIDE CRYS ER 20 MEQ PO TBCR
20.0000 meq | EXTENDED_RELEASE_TABLET | Freq: Once | ORAL | Status: AC
  Administered 2021-09-30: 09:00:00 20 meq via ORAL
  Filled 2021-09-30: qty 1

## 2021-09-30 MED ORDER — LACTATED RINGERS IV BOLUS
500.0000 mL | Freq: Once | INTRAVENOUS | Status: AC
  Administered 2021-09-30: 11:00:00 500 mL via INTRAVENOUS

## 2021-09-30 MED ORDER — INFLUENZA VAC SPLIT QUAD 0.5 ML IM SUSY
0.5000 mL | PREFILLED_SYRINGE | INTRAMUSCULAR | Status: AC
  Administered 2021-10-02: 11:00:00 0.5 mL via INTRAMUSCULAR
  Filled 2021-09-30: qty 0.5

## 2021-09-30 MED ORDER — LORAZEPAM 2 MG/ML IJ SOLN
1.0000 mg | Freq: Once | INTRAMUSCULAR | Status: AC
  Administered 2021-09-30: 20:00:00 1 mg via INTRAVENOUS
  Filled 2021-09-30 (×2): qty 1

## 2021-09-30 MED ORDER — LACTATED RINGERS IV SOLN
INTRAVENOUS | Status: DC
Start: 2021-09-30 — End: 2021-10-01

## 2021-09-30 MED ORDER — ENSURE ENLIVE PO LIQD
237.0000 mL | Freq: Two times a day (BID) | ORAL | Status: DC
  Administered 2021-09-30 – 2021-10-02 (×2): 237 mL via ORAL

## 2021-09-30 NOTE — Progress Notes (Signed)
Inpatient Diabetes Program Recommendations  AACE/ADA: New Consensus Statement on Inpatient Glycemic Control (2015)  Target Ranges:  Prepandial:   less than 140 mg/dL      Peak postprandial:   less than 180 mg/dL (1-2 hours)      Critically ill patients:  140 - 180 mg/dL   Lab Results  Component Value Date   GLUCAP 274 (H) 09/30/2021   HGBA1C 11.3 (H) 09/30/2021    Review of Glycemic Control  Latest Reference Range & Units 09/29/21 21:58 09/30/21 05:19 09/30/21 12:22  Glucose-Capillary 70 - 99 mg/dL 329 (H) 924 (H) 268 (H)  (H): Data is abnormally high  Diabetes history: DM2 Outpatient Diabetes medications: Metformin 500 mg BID & Amaryl 2 mg QD Current orders for Inpatient glycemic control: Novolog 0-15 units TID and 0-5 QHS  Inpatient Diabetes Program Recommendations:    Please consider 70/30 10 units BID (this is 14 units basal for 24hrs and 3 units meal coverage BID)  Spoke with patient at bedside.  Reviewed patient's current A1c of 11.3% (average blood sugar of 278 mg/dl).  Explained what a A1c is and what it measures. Also reviewed goal A1c with patient, importance of good glucose control @ home, and blood sugar goals.  He is current with his PCP.  He states his A1C hasn't been less than 9% is several years.  Discussed long and short term complications of elevated blood glucose.    He admits to not eating well.  He will usually skip breakfast or have a Little Debbie and a Dr. Reino Kent.  Discussed The Plate Method, CHO's, goal CHO's and importance of eliminating sugar beverages.  Discussed the impact of physical activity on blood glucose.  He is active on Friday and Saturday nights.  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    Ordered LWWD booklet.  He is agreeable to insulin moving forward.  Recommend 70/30 as he is uninsured and out of work.  TOC consult placed for medication assistance.  Explained he can buy 70/30 at [facility] for $43 for a box of 5 pens.   He has a  glucometer at home.  He checks his CBG's every couple of days.  Explained he will need to check his blood sugar at least 2 x a day while on insulin.    Dietician at bedside now.  Will teach insulin pen in am.    Will continue to follow while inpatient.  Thank you, Dulce Sellar, RN, BSN Diabetes Coordinator Inpatient Diabetes Program [phone] (team pager from 8a-5p)

## 2021-09-30 NOTE — Progress Notes (Signed)
Family Medicine Teaching Service Daily Progress Note Intern Pager: [phone]  Patient name: Eric Ortega Medical record number: [id number] Date of birth: 05-Jun-1961 Age: 60 y.o. Gender: male  Primary Care Provider: Evelena Peat, MD Consultants: Non Code Status: Full  Pt Overview and Major Events to Date:  12/25 - Admitted  Assessment and Plan: Eric Ortega is a 60 y.o. male with PMHx of T2DM, HLD, HTN, liver cirrhosis, barrett's esophagus who presented Fall, Weakness, and Altered Mental Status.   Acute encephalopathy, possible hypoperfusion event   Recurrent falls MRI negative for acute process. Upon further chart review, the visit in August for a similar presentation was diagnosed as orthostatic hypotension and patient was instructed to increase fluid intake, use compression hose, and slow transition movements. Vitamin B12 and folate within normal limits, UDS negative, CK wnl.  Given patient's changes in blood pressures, at this time feel that orthostatic hypotension may be a large component of patient's symptoms.  Patient has an abnormal speech pattern that has been a baseline for several months at this point, feels that the acute worsening has resolved.  - Orthostatic vitals - Closely monitor vital signs - Continue maintenance fluids and we will give small bolus at this time - PT/OT eval and treat - Continue holding sedating medications such as clonazepam, amitriptyline, gabapentin  Hypotension in the setting of chronic HTN BP remains on the softer side and most recently 73/53 with standing. Holding most home medications at this time, as feel they may have contributed to patient's symptoms. - Continue atenolol 25mg  daily - Continue to monitor BP with routine vitals - LR bolus  AKI Creatinine 1.76>1.74. Patient is currently on maintenance fluids of LR @125mL /hr.  - Continue to monitor with BMP - LR bolus  T2DM A1c is 11.3, sugars have remained elevated >200 consistently  and patient has only received 2U of Novolog so far. Patient has had issues with medication cost in the past, may need to check with pharmacy for copay checks to optimize glucose control. - continue CBG checks 4 times daily with meals and nightly - sensitive SSI - Holding home metformin and sulfonylurea   Hypokalemia K on BMP was 3.4. - Replete with Klor  Pseudohyponatremia Na 133 with glucose of 345, corrects to 137.  - Continue to monitor with BMP  HLD LDL 85, total cholesterol , HDL 48.  Patient has had issues with affording his statin medications in the past and has not taken it for some time. We will discuss with pharmacy and do a copay check to see what may be affordable for him. - Will need statin due to comorbid diabetes  Unintentional weight loss Weight documented on 10/19/2020 was 241lbs and patient is currently 208lb. Do feel that decreased oral intake has been affected by dental status change (has removable upper implants). Do not have other red flags at this time. TSH wnl - SLP evaluation - Consider RD consult - May need further outpatient work-up  Liver cirrhosis   chronic thrombocytopenia Per documentation, liver cirrhosis noted in 2015. Imaging with CT scan on 10/19/2020 showed liver consistent with cirrhosis, porta HTN with splenomegaly and splenorenal shunt. GI notes from 10/18/2020 state related to fatty liver. Patient's MELD-Na score is 16 (once sodium is corrected for hyperglycemia). Not sure that there is a significant alcoholic cirrhosis picture, though alcohol use has been elevated in the past. CIWA overnight was 4. Platelets 78, baseline appears to be in the 80s. - Continue to monitor with CBC -  Lovenox for DVT prophylaxis - Continue CIWAs, consider discontinuing if overall normal today  GAD/MDD Medication history completed this morning with amitriptyline 100mg  nightly and clonazepam 0.5mg  TID as needed for anxiety. Patient also has gabapentin, unsure if  related to anxiety or neuropathy. - Holding home medications as they have sedating qualities, will consider restarting if clinically improving   FEN/GI: Carb modified PPx: lovenox Dispo:Pending PT recommendations  pending clinical improvement . Barriers include continued medical work-up.   Subjective:  Patient reports he is still feeling about the same as he was today.  He does note that his speech is essentially as baseline as he has had difficulty with his word finding for some time now.  He does not have any specific pain anywhere in particular.  Objective: Temp:  [97.7 F (36.5 C)-98.2 F (36.8 C)] 97.8 F (36.6 C) (12/26 0941) Pulse Rate:  [58-74] 74 (12/26 0943) Resp:  [13-19] 17 (12/26 0834) BP: (73-138)/(53-84) 73/53 (12/26 0943) SpO2:  [95 %-100 %] 97 % (12/26 0943) Weight:  [94.5 kg-98.4 kg] 94.5 kg (12/25 2156) Physical Exam: General: Elderly appearing male, NAD, supine in bed resting comfortably Cardiovascular: RRR, no murmur appreciated Respiratory: Breathing currently room air, no increased work of breathing Abdomen: Soft, nontender, nondistended Extremities: Moving all extremities Neuro: A&O x3, able to move all extremities, speech mildly delayed with word finding but able to converse well.  Laboratory: Recent Labs  Lab 09/29/21 1305 09/30/21 0252  WBC 5.2 5.1  HGB 13.1 12.7*  HCT 35.9* 34.9*  PLT 87* 78*   Recent Labs  Lab 09/29/21 1305 09/30/21 0252  NA 131* 133*  K 4.2 3.4*  CL 100 102  CO2 21* 22  BUN 27* 28*  CREATININE 1.76* 1.74*  CALCIUM 8.8* 8.8*  PROT 5.7* 5.8*  BILITOT 1.0 1.1  ALKPHOS 151* 101  ALT 13 15  AST 24 19  GLUCOSE 487* 345*     Imaging/Diagnostic Tests: CT HEAD WO CONTRAST (10/02/21)  Result Date: 09/29/2021 CLINICAL DATA:  Multiple falls EXAM: CT HEAD WITHOUT CONTRAST CT CERVICAL SPINE WITHOUT CONTRAST TECHNIQUE: Multidetector CT imaging of the head and cervical spine was performed following the standard protocol without  intravenous contrast. Multiplanar CT image reconstructions of the cervical spine were also generated. COMPARISON:  11/07/2020 FINDINGS: CT HEAD FINDINGS Brain: No evidence of acute infarction, hemorrhage, hydrocephalus, extra-axial collection or mass lesion/mass effect. Vascular: No hyperdense vessel or unexpected calcification. Skull: Normal. Negative for fracture or focal lesion. Sinuses/Orbits: No acute finding. Other: None. CT CERVICAL SPINE FINDINGS Alignment: Normal. Skull base and vertebrae: No acute fracture. No primary bone lesion or focal pathologic process. Soft tissues and spinal canal: No prevertebral fluid or swelling. No visible canal hematoma. Disc levels: Mild to moderate multilevel disc space height loss and osteophytosis of the lower cervical levels. Upper chest: Negative. Other: None. IMPRESSION: 1. No acute intracranial pathology. 2. No fracture or static subluxation of the cervical spine. 3. Mild to moderate multilevel cervical disc degenerative disease. Electronically Signed   By: 01/05/2021 M.D.   On: 09/29/2021 13:43   CT Cervical Spine Wo Contrast  Result Date: 09/29/2021 CLINICAL DATA:  Multiple falls EXAM: CT HEAD WITHOUT CONTRAST CT CERVICAL SPINE WITHOUT CONTRAST TECHNIQUE: Multidetector CT imaging of the head and cervical spine was performed following the standard protocol without intravenous contrast. Multiplanar CT image reconstructions of the cervical spine were also generated. COMPARISON:  11/07/2020 FINDINGS: CT HEAD FINDINGS Brain: No evidence of acute infarction, hemorrhage, hydrocephalus, extra-axial collection or mass  lesion/mass effect. Vascular: No hyperdense vessel or unexpected calcification. Skull: Normal. Negative for fracture or focal lesion. Sinuses/Orbits: No acute finding. Other: None. CT CERVICAL SPINE FINDINGS Alignment: Normal. Skull base and vertebrae: No acute fracture. No primary bone lesion or focal pathologic process. Soft tissues and spinal canal: No  prevertebral fluid or swelling. No visible canal hematoma. Disc levels: Mild to moderate multilevel disc space height loss and osteophytosis of the lower cervical levels. Upper chest: Negative. Other: None. IMPRESSION: 1. No acute intracranial pathology. 2. No fracture or static subluxation of the cervical spine. 3. Mild to moderate multilevel cervical disc degenerative disease. Electronically Signed   By: Jearld Lesch M.D.   On: 09/29/2021 13:43   MR BRAIN WO CONTRAST  Result Date: 09/29/2021 CLINICAL DATA:  Transient ischemic attack.  Confusion and weakness. EXAM: MRI HEAD WITHOUT CONTRAST TECHNIQUE: Multiplanar, multiecho pulse sequences of the brain and surrounding structures were obtained without intravenous contrast. COMPARISON:  Head CT same day FINDINGS: Brain: Diffusion imaging does not show any acute or subacute infarction. The brainstem and cerebellum are normal. Cerebral hemispheres show a few punctate foci of T2 and FLAIR signal within the frontal white matter, often seen at this age and not likely significant. No evidence of widespread small-vessel disease. No cortical abnormality. No mass, hemorrhage, hydrocephalus or extra-axial collection. Vascular: Major vessels at the base of the brain show flow. Skull and upper cervical spine: Negative Sinuses/Orbits: Clear/normal Other: None IMPRESSION: Normal study for age.  No acute finding. Electronically Signed   By: Paulina Fusi M.D.   On: 09/29/2021 21:10     Evelena Leyden, DO 09/30/2021, 10:33 AM PGY-2, [facility] FPTS Intern pager: [phone], text pages welcome

## 2021-09-30 NOTE — Evaluation (Signed)
Occupational Therapy Evaluation Patient Details Name: Eric Ortega MRN: [id number] DOB: 18-Jul-1961 Today's Date: 09/30/2021   History of Present Illness Patient is a 60 y/o male who presents on 09/29/21 with confusion, hypotension, weakness, left facial droop and unilateral weakness with 4 falls in last few days. Found to have acute toxic/metabolic encephalopathy, possible hypoperfusion event, AKI, hyperglycemia and increased ammonia. Brain MRI and Head CT-unremarkable. PMH includes DM, liver cirrhosis, barrett's esophagus and HTN.   Clinical Impression   PTA pt lives at home with his elderly Mom and has had multiple falls. Pt also complains of "jerking" movements of his arms/ arms "falling off of the steering wheel when driving". Note new onset of R footdrop and decreased sensation R foot. Pt orthostatic: sitting 165/97; standing 125/77; sitting 179/92; standing 137/75, but not symptomatic. Began education regarding completion of "counter pressure" exercises before mobilizing. Pt will need to follow up with a prosthetist as an outpt for assessment of an AFO. Discussed use of TEDS with nsg. Will follow acutely however do not anticipate the need for follow up OT after DC.      Recommendations for follow up therapy are one component of a multi-disciplinary discharge planning process, led by the attending physician.  Recommendations may be updated based on patient status, additional functional criteria and insurance authorization.   Follow Up Recommendations  Other (comment) (follow up with prosthetist for fabrication of AFO)    Assistance Recommended at Discharge Intermittent Supervision/Assistance  Functional Status Assessment  Patient has had a recent decline in their functional status and demonstrates the ability to make significant improvements in function in a reasonable and predictable amount of time.  Equipment Recommendations  None recommended by OT    Recommendations for Other Services        Precautions / Restrictions Precautions Precautions: Fall;Other (comment) Precaution Comments: 12-14 falls in last 6 months, orthostatic hypotension; R footdrop Restrictions Weight Bearing Restrictions: No      Mobility Bed Mobility Overal bed mobility: Modified Independent Bed Mobility: Supine to Sit;Sit to Supine     Supine to sit: Modified independent (Device/Increase time);HOB elevated Sit to supine: Modified independent (Device/Increase time);HOB elevated   General bed mobility comments: No assist needed.    Transfers Overall transfer level: Needs assistance Equipment used: None Transfers: Sit to/from Stand Sit to Stand: Supervision           General transfer comment: Supervision for safety due to drop in BP.      Balance Overall balance assessment: History of Falls;Needs assistance Sitting-balance support: Feet supported;No upper extremity supported Sitting balance-Leahy Scale: Good     Standing balance support: During functional activity Standing balance-Leahy Scale: Fair Standing balance comment: Supervision for safety.                           ADL either performed or assessed with clinical judgement   ADL Overall ADL's : At baseline                                       General ADL Comments: at baseline with basic ADL tasks     Vision Baseline Vision/History: 1 Wears glasses Vision Assessment?: No apparent visual deficits     Perception     Praxis      Pertinent Vitals/Pain Pain Assessment: No/denies pain     Hand Dominance Right   Extremity/Trunk Assessment  Upper Extremity Assessment Upper Extremity Assessment: Overall WFL for tasks assessed (hx of tremors;reports diffiuclty with sudden "jerky" movemetns where his hands "just fall off of the steering wheel")   Lower Extremity Assessment Lower Extremity Assessment: RLE deficits/detail RLE Deficits / Details: No ankle AROM, foot drop present RLE  Sensation: decreased light touch   Cervical / Trunk Assessment Cervical / Trunk Assessment: Normal   Communication Communication Communication: No difficulties   Cognition Arousal/Alertness: Awake/alert Behavior During Therapy: WFL for tasks assessed/performed Overall Cognitive Status: Within Functional Limits for tasks assessed                                 General Comments: Reports he is better today with regards to cognition, no slurred speech/confusion. Will further assess     General Comments      Exercises Exercises: Other exercises Other Exercises Other Exercises: Began educating regarding counter pressure exercises   Shoulder Instructions      Home Living Family/patient expects to be discharged to:: Private residence Living Arrangements: Parent (with mother) Available Help at Discharge: Family;Available PRN/intermittently Type of Home: House Home Access: Level entry     Home Layout: One level     Bathroom Shower/Tub: Producer, television/film/video: Standard Bathroom Accessibility: Yes How Accessible: Accessible via walker Home Equipment: Shower seat;Rolling Walker (2 wheels);Wheelchair Financial trader (4 wheels);Grab bars - tub/shower          Prior Functioning/Environment Prior Level of Function : Independent/Modified Independent             Mobility Comments: Works in [employer] but unemployed at the moment, reports multiple falls (~12-14 times in last 6 months), works in the yard, dances a few times per week, ballroom dancing and shags. ADLs Comments: Does own ADLs, cooks/cleans, grocery shops        OT Problem List: Decreased strength;Impaired balance (sitting and/or standing);Decreased safety awareness;Decreased knowledge of use of DME or AE;Impaired sensation;Cardiopulmonary status limiting activity      OT Treatment/Interventions: Self-care/ADL training;Therapeutic exercise;DME and/or AE instruction;Therapeutic  activities;Patient/family education;Balance training    OT Goals(Current goals can be found in the care plan section) Acute Rehab OT Goals Patient Stated Goal: to find out what is going on OT Goal Formulation: With patient Time For Goal Achievement: 10/14/21 Potential to Achieve Goals: Good  OT Frequency: Min 2X/week   Barriers to D/C:            Co-evaluation              AM-PAC OT "6 Clicks" Daily Activity     Outcome Measure Help from another person eating meals?: None Help from another person taking care of personal grooming?: None Help from another person toileting, which includes using toliet, bedpan, or urinal?: None Help from another person bathing (including washing, rinsing, drying)?: None Help from another person to put on and taking off regular upper body clothing?: None Help from another person to put on and taking off regular lower body clothing?: None 6 Click Score: 24   End of Session Equipment Utilized During Treatment: Gait belt Nurse Communication: Mobility status;Other (comment) (needs TEDs)  Activity Tolerance: Patient tolerated treatment well Patient left: in bed;with call bell/phone within reach;with family/visitor present  OT Visit Diagnosis: Unsteadiness on feet (R26.81);Repeated falls (R29.6);Muscle weakness (generalized) (M62.81)                Time: 1349-1410 OT Time Calculation (min): 21  min Charges:  OT General Charges $OT Visit: 1 Visit OT Evaluation $OT Eval Moderate Complexity: 1 Mod  Damion Kant, OT/L   Acute OT Clinical Specialist Acute Rehabilitation Services Pager [phone] Office [phone]   [facility] 09/30/2021, 2:30 PM

## 2021-09-30 NOTE — Evaluation (Addendum)
Physical Therapy Evaluation Patient Details Name: Eric Ortega MRN: [id number] DOB: 1961-05-08 Today's Date: 09/30/2021  History of Present Illness  Patient is a 60 y/o male who presents on 09/29/21 with confusion, hypotension, weakness, left facial droop and unilateral weakness with 4 falls in last few days. Found to have acute toxic/metabolic encephalopathy, possible hypoperfusion event, AKI, hyperglycemia and increased ammonia. Brain MRI and Head CT-unremarkable. PMH includes DM, liver cirrhosis, barrett's esophagus and HTN.  Clinical Impression  Patient presents with right foot drop (which pt reports as new in the last 3 weeks), impaired balance, positive orthostatic hypotension and impaired mobility s/p above. Pt lives at home with his mother and reports being independent for ADls/IADLs at baseline. Has history of falls, ~12-14 in last 6 months. Today, pt tolerated transfers and ambulation with supervision-min guard for safety.  Supine BP 163/92, HR 75 Sitting BP 137/79, HR 72 bpm Standing BP 108/63 HR 74 bpm BP post walking 161/88    Pt asymptomatic during testing. Recommend trying ted hose or ace wraps to improve BP stabilization. 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 Discussed need for possible AFO for right foot pending improvement. Will follow acutely to maximize independence and mobility prior to return home.   Recommendations for follow up therapy are one component of a multi-disciplinary discharge planning process, led by the attending physician.  Recommendations may be updated based on patient status, additional functional criteria and insurance authorization.  Follow Up Recommendations Outpatient PT    Assistance Recommended at Discharge Intermittent Supervision/Assistance  Functional Status Assessment Patient has had a recent decline in their functional status and demonstrates the ability to make significant  improvements in function in a reasonable and predictable amount of time.  Equipment Recommendations  None recommended by PT    Recommendations for Other Services       Precautions / Restrictions Precautions Precautions: Fall;Other (comment) Precaution Comments: 12-14 falls in last 6 months, orthostatic hypotension; R footdrop Restrictions Weight Bearing Restrictions: No      Mobility  Bed Mobility Overal bed mobility: Modified Independent Bed Mobility: Supine to Sit;Sit to Supine     Supine to sit: Modified independent (Device/Increase time);HOB elevated Sit to supine: Modified independent (Device/Increase time);HOB elevated   General bed mobility comments: No assist needed.    Transfers Overall transfer level: Needs assistance Equipment used: None Transfers: Sit to/from Stand Sit to Stand: Supervision           General transfer comment: Supervision for safety due to drop in BP.    Ambulation/Gait Ambulation/Gait assistance: Min guard Gait Distance (Feet): 30 Feet Assistive device: IV Pole Gait Pattern/deviations: Step-through pattern;Decreased stride length;Decreased dorsiflexion - right       General Gait Details: Slow, steady gait, reports he staggers at baseline. no dizziness. DIstance limited due to positive orthostatic hypotension.  Stairs            Wheelchair Mobility    Modified Rankin (Stroke Patients Only)       Balance Overall balance assessment: History of Falls;Needs assistance Sitting-balance support: Feet supported;No upper extremity supported Sitting balance-Leahy Scale: Good     Standing balance support: During functional activity Standing balance-Leahy Scale: Fair Standing balance comment: Supervision for safety.                             Pertinent Vitals/Pain Pain Assessment: No/denies pain  Home Living Family/patient expects to be discharged to:: Private residence Living Arrangements: Parent (with  mother) Available Help at Discharge: Family;Available PRN/intermittently Type of Home: House Home Access: Level entry       Home Layout: One level Home Equipment: Pharmacist, hospital (2 wheels);Wheelchair Financial trader (4 wheels);Grab bars - tub/shower      Prior Function Prior Level of Function : Independent/Modified Independent             Mobility Comments: Works in [employer] but unemployed at the moment, reports multiple falls (~12-14 times in last 6 months), works in the yard, dances a few times per week, ballroom dancing and shags. ADLs Comments: Does own ADLs, cooks/cleans, grocery shops     Hand Dominance   Dominant Hand: Right    Extremity/Trunk Assessment   Upper Extremity Assessment Upper Extremity Assessment: Overall WFL for tasks assessed (hx of tremors;reports diffiuclty with sudden "jerky" movemetns where his hands "just fall off of the steering wheel")    Lower Extremity Assessment Lower Extremity Assessment: RLE deficits/detail RLE Deficits / Details: No ankle AROM, foot drop present RLE Sensation: decreased light touch    Cervical / Trunk Assessment Cervical / Trunk Assessment: Normal  Communication   Communication: No difficulties  Cognition Arousal/Alertness: Awake/alert Behavior During Therapy: WFL for tasks assessed/performed Overall Cognitive Status: Within Functional Limits for tasks assessed                                 General Comments: Reports he is better today with regards to cognition, no slurred speech/confusion. Will further assess        General Comments General comments (skin integrity, edema, etc.): Supine BP 163/92, HR 75, sitting BP 137/79, HR 72 bpm, standing BP 108/63 HR 74 bpm, BP post walking 161/88    Exercises Other Exercises Other Exercises: Began educating regarding counter pressure exercises   Assessment/Plan    PT Assessment Patient needs continued PT services  PT Problem List Decreased  range of motion;Decreased strength;Impaired sensation;Decreased balance;Cardiopulmonary status limiting activity       PT Treatment Interventions Functional mobility training;Balance training;Gait training;Therapeutic exercise;Patient/family education;Therapeutic activities;DME instruction;Modalities    PT Goals (Current goals can be found in the Care Plan section)  Acute Rehab PT Goals Patient Stated Goal: to get better, stop falling PT Goal Formulation: With patient Time For Goal Achievement: 10/14/21 Potential to Achieve Goals: Good Additional Goals Additional Goal #1: Pt will score >19/24 on the DGI to indicate decrease risk for falls.    Frequency Min 3X/week   Barriers to discharge Decreased caregiver support      Co-evaluation               AM-PAC PT "6 Clicks" Mobility  Outcome Measure Help needed turning from your back to your side while in a flat bed without using bedrails?: None Help needed moving from lying on your back to sitting on the side of a flat bed without using bedrails?: None Help needed moving to and from a bed to a chair (including a wheelchair)?: A Little Help needed standing up from a chair using your arms (e.g., wheelchair or bedside chair)?: A Little Help needed to walk in hospital room?: A Little Help needed climbing 3-5 steps with a railing? : A Little 6 Click Score: 20    End of Session Equipment Utilized During Treatment: Gait belt Activity Tolerance: Treatment limited secondary to medical complications (Comment) (orthostatic hypotension)  Patient left: in bed;with call bell/phone within reach;with bed alarm set Nurse Communication: Mobility status;Other (comment) (BP) PT Visit Diagnosis: History of falling (Z91.81);Difficulty in walking, not elsewhere classified (R26.2)    Time: 8206-0156 PT Time Calculation (min) (ACUTE ONLY): 32 min   Charges:   PT Evaluation $PT Eval Moderate Complexity: 1 Mod PT Treatments $Therapeutic  Activity: 8-22 mins        Vale Haven, PT, DPT Acute Rehabilitation Services Pager [phone] Office [phone]     Blake Divine A Lanier Ensign 09/30/2021, 2:51 PM

## 2021-09-30 NOTE — Evaluation (Signed)
Clinical/Bedside Swallow Evaluation Patient Details  Name: Eric Ortega MRN: [id number] Date of Birth: Mar 06, 1961  Today's Date: 09/30/2021 Time: SLP Start Time (ACUTE ONLY): 1136 SLP Stop Time (ACUTE ONLY): 1151 SLP Time Calculation (min) (ACUTE ONLY): 15 min  Past Medical History:  Past Medical History:  Diagnosis Date   Diabetes mellitus without complication (HCC)    Hyperlipidemia    Hypertension    Past Surgical History:  Past Surgical History:  Procedure Laterality Date   APPENDECTOMY     HPI:  Eric Ortega is a 60 y.o. male with PMHx of T2DM, HLD, HTN, liver cirrhosis, barrett's esophagus who presented Fall, Weakness, and Altered Mental Status. Pt has had difficulty with word finding for some time per MD notes. Pt also with unintentional weight loss.    Assessment / Plan / Recommendation  Clinical Impression  Pt presents with no acute difficulty with swallowing, but does report early satiety and lack of appetite. Also reports struggling with mastication with new denture including choosing the right foods and preparing foods appropriately. Do not want to restrict diet at this time given need for a variety of food choices, but offered basic strategies for oral hygiene and chopping proteins and choosing soft proteins to make intake more manageable. Pt looking forward to futher discussion with dietitian. May also beneift from follow up with GI given digestive complaints. No further SLP f/u needed. SLP Visit Diagnosis: Dysphagia, oral phase (R13.11)    Aspiration Risk  Risk for inadequate nutrition/hydration    Diet Recommendation Regular;Thin liquid;Other (Comment) (pt should coose soft foods)   Medication Administration: Whole meds with liquid Supervision: Patient able to self feed    Other  Recommendations      Recommendations for follow up therapy are one component of a multi-disciplinary discharge planning process, led by the attending physician.  Recommendations may be  updated based on patient status, additional functional criteria and insurance authorization.  Follow up Recommendations        Assistance Recommended at Discharge    Functional Status Assessment    Frequency and Duration            Prognosis        Swallow Study   General HPI: Eric Ortega is a 60 y.o. male with PMHx of T2DM, HLD, HTN, liver cirrhosis, barrett's esophagus who presented Fall, Weakness, and Altered Mental Status. Pt has had difficulty with word finding for some time per MD notes. Pt also with unintentional weight loss. Type of Study: Bedside Swallow Evaluation Diet Prior to this Study: Regular;Thin liquids Temperature Spikes Noted: No Respiratory Status: Room air;Nasal cannula History of Recent Intubation: No Behavior/Cognition: Alert;Cooperative;Pleasant mood Oral Cavity Assessment: Within Functional Limits Oral Care Completed by SLP: Yes Oral Cavity - Dentition: Dentures, top Vision: Functional for self-feeding Self-Feeding Abilities: Able to feed self Patient Positioning: Upright in bed Baseline Vocal Quality: Normal    Oral/Motor/Sensory Function Overall Oral Motor/Sensory Function: Within functional limits   Ice Chips Ice chips: Not tested   Thin Liquid Thin Liquid: Within functional limits    Nectar Thick Nectar Thick Liquid: Not tested   Honey Thick Honey Thick Liquid: Not tested   Puree     Solid     Solid: Impaired Presentation: Self Fed Oral Phase Impairments: Impaired mastication Other Comments: loose denture      Valeta Paz, Riley Nearing 09/30/2021,11:56 AM

## 2021-09-30 NOTE — Plan of Care (Signed)

## 2021-09-30 NOTE — Consult Note (Signed)
[facility] Renal Consultation Note  Requesting MD: Nori Riis Indication for Consultation: AKI, hyponatremia-  non gap acidosis   HPI:  Eric Ortega is a 60 y.o. male with past medical history significant for DM- poorly controlled,  ? cirrhosis, HTN and polypharmacy-  on elavil, klonopin and neurontin as OP.  He was brought to the ED yesterday with c/o weakness and falls -  was hypotensive and had crt of 1.7 where it had been normal 11 mos ago.  In addition he had some mild hyponatremia, hypokalemia and a non gap metabolic acidosis.  Of note, his home meds include lisinopril and chlorthalidone.   He has been fluids but remains slightly orthostatic.  He is sitting up on the edge of his bed today saying that he feels much better.  All OP meds have been put on hold except for atenolol-  he acknowledges that his sugar has been high at home and he had not been eating or drinking much the last few days   Creatinine, Ser  Date/Time Value Ref Range Status  09/30/2021 02:52 AM 1.74 (H) 0.61 - 1.24 mg/dL Final  09/29/2021 01:05 PM 1.76 (H) 0.61 - 1.24 mg/dL Final  10/19/2020 10:39 AM 0.60 (L) 0.61 - 1.24 mg/dL Final  10/19/2020 10:23 AM 0.72 0.61 - 1.24 mg/dL Final  01/06/2018 01:34 PM 0.72 0.61 - 1.24 mg/dL Final  06/18/2017 10:42 AM 0.80 0.61 - 1.24 mg/dL Final     PMHx:   Past Medical History:  Diagnosis Date   Diabetes mellitus without complication ([place])    Hyperlipidemia    Hypertension     Past Surgical History:  Procedure Laterality Date   APPENDECTOMY      Family Hx:  Family History  Problem Relation Age of Onset   Diabetes Mother    Heart failure Mother    Heart failure Father     Social History:  reports that he has quit smoking. He has never used smokeless tobacco. He reports that he does not drink alcohol and does not use drugs.  Allergies: No Known Allergies  Medications: Prior to Admission medications   Medication Sig Start Date End Date Taking?  Authorizing Provider  amitriptyline (ELAVIL) 100 MG tablet Take 100 mg by mouth at bedtime. 09/04/21  Yes [provider]  atenolol (TENORMIN) 100 MG tablet Take 100 mg by mouth every evening.    Yes [provider]  chlorthalidone (HYGROTON) 25 MG tablet Take 25 mg by mouth daily. 09/04/21  Yes [provider]  clonazePAM (KLONOPIN) 0.5 MG tablet Take 0.5 mg by mouth 3 (three) times daily as needed for anxiety.   Yes [provider]  gabapentin (NEURONTIN) 600 MG tablet Take 600 mg by mouth 3 (three) times daily. 09/27/21  Yes [provider]  glimepiride (AMARYL) 2 MG tablet Take 2 mg by mouth daily with breakfast.   Yes [provider]  lisinopril (ZESTRIL) 30 MG tablet Take 30 mg by mouth daily.   Yes [provider]  metFORMIN (GLUCOPHAGE) 500 MG tablet Take 500 mg by mouth 2 (two) times daily. 07/09/21  Yes [provider]  Blood Glucose Monitoring Suppl (ONETOUCH VERIO) w/Device KIT 1 each by Does not apply route as needed. 04/03/14   [provider]  ONETOUCH DELICA LANCETS 35K MISC 1 each by Does not apply route as needed. 04/03/14   [provider]    I have reviewed the patient's current medications.  Labs:  Results for orders placed or performed  during the hospital encounter of 09/29/21 (from the past 48 hour(s))  Comprehensive metabolic panel     Status: Abnormal   Collection Time: 09/29/21  1:05 PM  Result Value Ref Range   Sodium 131 (L) 135 - 145 mmol/L   Potassium 4.2 3.5 - 5.1 mmol/L   Chloride 100 98 - 111 mmol/L   CO2 21 (L) 22 - 32 mmol/L   Glucose, Bld 487 (H) 70 - 99 mg/dL    Comment: Glucose reference range applies only to samples taken after fasting for at least 8 hours.   BUN 27 (H) 8 - 23 mg/dL   Creatinine, Ser 1.76 (H) 0.61 - 1.24 mg/dL   Calcium 8.8 (L) 8.9 - 10.3 mg/dL   Total Protein 5.7 (L) 6.5 - 8.1 g/dL   Albumin 3.2 (L) 3.5 - 5.0 g/dL   AST 24 15 - 41 U/L   ALT 13  0 - 44 U/L   Alkaline Phosphatase 151 (H) 38 - 126 U/L   Total Bilirubin 1.0 0.3 - 1.2 mg/dL   GFR, Estimated 43 (L) >60 mL/min    Comment: (NOTE) Calculated using the CKD-EPI Creatinine Equation (2021)    Anion gap 10 5 - 15    Comment: Performed at [facility], [place] [address], [place], [place] [address]  CBC with Differential     Status: Abnormal   Collection Time: 09/29/21  1:05 PM  Result Value Ref Range   WBC 5.2 4.0 - 10.5 K/uL   RBC 4.25 4.22 - 5.81 MIL/uL   Hemoglobin 13.1 13.0 - 17.0 g/dL   HCT 35.9 (L) 39.0 - 52.0 %   MCV 84.5 80.0 - 100.0 fL   MCH 30.8 26.0 - 34.0 pg   MCHC 36.5 (H) 30.0 - 36.0 g/dL   RDW 12.9 11.5 - 15.5 %   Platelets 87 (L) 150 - 400 K/uL    Comment: Immature Platelet Fraction may be clinically indicated, consider ordering this additional test [id number] REPEATED TO VERIFY PLATELET COUNT CONFIRMED BY SMEAR    nRBC 0.0 0.0 - 0.2 %   Neutrophils Relative % 56 %   Neutro Abs 2.9 1.7 - 7.7 K/uL   Lymphocytes Relative 25 %   Lymphs Abs 1.3 0.7 - 4.0 K/uL   Monocytes Relative 10 %   Monocytes Absolute 0.5 0.1 - 1.0 K/uL   Eosinophils Relative 7 %   Eosinophils Absolute 0.4 0.0 - 0.5 K/uL   Basophils Relative 1 %   Basophils Absolute 0.1 0.0 - 0.1 K/uL   Immature Granulocytes 1 %   Abs Immature Granulocytes 0.03 0.00 - 0.07 K/uL    Comment: Performed at [place] [address], [place], [place] [address]  Troponin I (High Sensitivity)     Status: None   Collection Time: 09/29/21  1:05 PM  Result Value Ref Range   Troponin I (High Sensitivity) 5 <18 ng/L    Comment: (NOTE) Elevated high sensitivity troponin I (hsTnI) values and significant  changes across serial measurements may suggest ACS but many other  chronic and acute conditions are known to elevate hsTnI results.  Refer to the "Links" section for chest pain algorithms and additional  guidance. Performed at [facility], [place] [address], [place], [place] [address]    Ammonia     Status: Abnormal   Collection Time: 09/29/21  1:05 PM  Result Value Ref Range   Ammonia 65 (H) 9 - 35 umol/L    Comment: Performed  at [facility], [place] [address], [place], [place] [address]  Resp Panel by RT-PCR (Flu A&B, Covid) Nasopharyngeal Swab     Status: None   Collection Time: 09/29/21  1:05 PM   Specimen: Nasopharyngeal Swab; Nasopharyngeal(NP) swabs in vial transport medium  Result Value Ref Range   SARS Coronavirus 2 by RT PCR NEGATIVE NEGATIVE    Comment: (NOTE) SARS-CoV-2 target nucleic acids are NOT DETECTED.  The SARS-CoV-2 RNA is generally detectable in upper respiratory specimens during the acute phase of infection. The lowest concentration of SARS-CoV-2 viral copies this assay can detect is 138 copies/mL. A negative result does not preclude SARS-Cov-2 infection and should not be used as the sole basis for treatment or other patient management decisions. A negative result may occur with  improper specimen collection/handling, submission of specimen other than nasopharyngeal swab, presence of viral mutation(s) within the areas targeted by this assay, and inadequate number of viral copies(<138 copies/mL). A negative result must be combined with clinical observations, patient history, and epidemiological information. The expected result is Negative.  Fact Sheet for Patients:  EntrepreneurPulse.com.au  Fact Sheet for Healthcare Providers:  IncredibleEmployment.be  This test is no t yet approved or cleared by the [place] FDA and  has been authorized for detection and/or diagnosis of SARS-CoV-2 by FDA under an Emergency Use Authorization (EUA). This EUA will remain  in effect (meaning this test can be used) for the duration of the COVID-19 declaration under Section 564(b)(1) of the Act, 21 U.S.C.section 360bbb-3(b)(1), unless the authorization is terminated  or revoked sooner.       Influenza A by PCR  NEGATIVE NEGATIVE   Influenza B by PCR NEGATIVE NEGATIVE    Comment: (NOTE) The Xpert Xpress SARS-CoV-2/FLU/RSV plus assay is intended as an aid in the diagnosis of influenza from Nasopharyngeal swab specimens and should not be used as a sole basis for treatment. Nasal washings and aspirates are unacceptable for Xpert Xpress SARS-CoV-2/FLU/RSV testing.  Fact Sheet for Patients: EntrepreneurPulse.com.au  Fact Sheet for Healthcare Providers: IncredibleEmployment.be  This test is not yet approved or cleared by the [place] FDA and has been authorized for detection and/or diagnosis of SARS-CoV-2 by FDA under an Emergency Use Authorization (EUA). This EUA will remain in effect (meaning this test can be used) for the duration of the COVID-19 declaration under Section 564(b)(1) of the Act, 21 U.S.C. section 360bbb-3(b)(1), unless the authorization is terminated or revoked.  Performed at [facility], [place] [address] [place], [place] [address]   Protime-INR     Status: None   Collection Time: 09/29/21  1:05 PM  Result Value Ref Range   Prothrombin Time 14.8 11.4 - 15.2 seconds   INR 1.2 0.8 - 1.2    Comment: (NOTE) INR goal varies based on device and disease states. Performed at [facility], [place] [address], [place], [place] [address]   Ethanol     Status: None   Collection Time: 09/29/21  1:05 PM  Result Value Ref Range   Alcohol, Ethyl (B) <10 <10 mg/dL    Comment: (NOTE) Lowest detectable limit for serum alcohol is 10 mg/dL.  For medical purposes only. Performed at [facility], [place] [address] [place], [place] [address]   Troponin I (High Sensitivity)     Status: None   Collection Time: 09/29/21  3:04 PM  Result Value Ref Range   Troponin I (High Sensitivity) 5 <18 ng/L    Comment: (NOTE) Elevated  high sensitivity troponin I (hsTnI) values and significant  changes across serial measurements may suggest ACS but  many other  chronic and acute conditions are known to elevate hsTnI results.  Refer to the "Links" section for chest pain algorithms and additional  guidance. Performed at [facility], [place] [address], [place], [place] [address]   CK     Status: None   Collection Time: 09/29/21  3:04 PM  Result Value Ref Range   Total CK 56 49 - 397 U/L    Comment: Performed at [facility], [place] [address], [place], [place] [address]  Glucose, capillary     Status: Abnormal   Collection Time: 09/29/21  9:58 PM  Result Value Ref Range   Glucose-Capillary 223 (H) 70 - 99 mg/dL    Comment: Glucose reference range applies only to samples taken after fasting for at least 8 hours.  HIV Antibody (routine testing w rflx)     Status: None   Collection Time: 09/30/21  2:52 AM  Result Value Ref Range   HIV Screen 4th Generation wRfx Non Reactive Non Reactive    Comment: Performed at [facility], [place] [address] [place], [place] [address]  TSH     Status: None   Collection Time: 09/30/21  2:52 AM  Result Value Ref Range   TSH 0.878 0.350 - 4.500 uIU/mL    Comment: Performed by a 3rd Generation assay with a functional sensitivity of <=0.01 uIU/mL. Performed at [facility], [place] [address], [place], [place] [address]   Comprehensive metabolic panel     Status: Abnormal   Collection Time: 09/30/21  2:52 AM  Result Value Ref Range   Sodium 133 (L) 135 - 145 mmol/L   Potassium 3.4 (L) 3.5 - 5.1 mmol/L    Comment: DELTA CHECK NOTED   Chloride 102 98 - 111 mmol/L   CO2 22 22 - 32 mmol/L   Glucose, Bld 345 (H) 70 - 99 mg/dL    Comment: Glucose reference range applies only to samples taken after fasting for at least 8 hours.   BUN 28 (H) 8 - 23 mg/dL   Creatinine, Ser 1.74 (H) 0.61 - 1.24 mg/dL   Calcium 8.8 (L) 8.9 - 10.3 mg/dL   Total Protein 5.8 (L) 6.5 - 8.1 g/dL   Albumin 3.2 (L) 3.5 - 5.0 g/dL   AST 19 15 - 41 U/L   ALT 15 0 - 44 U/L   Alkaline Phosphatase 101 38 - 126 U/L    Total Bilirubin 1.1 0.3 - 1.2 mg/dL   GFR, Estimated 44 (L) >60 mL/min    Comment: (NOTE) Calculated using the CKD-EPI Creatinine Equation (2021)    Anion gap 9 5 - 15    Comment: Performed at [facility], [place] [address], [place], [place] [address]  CBC     Status: Abnormal   Collection Time: 09/30/21  2:52 AM  Result Value Ref Range   WBC 5.1 4.0 - 10.5 K/uL   RBC 4.22 4.22 - 5.81 MIL/uL   Hemoglobin 12.7 (L) 13.0 - 17.0 g/dL   HCT 34.9 (L) 39.0 - 52.0 %   MCV 82.7 80.0 - 100.0 fL   MCH 30.1 26.0 - 34.0 pg   MCHC 36.4 (H) 30.0 - 36.0 g/dL   RDW 13.1 11.5 - 15.5 %   Platelets 78 (L) 150 - 400 K/uL    Comment: Immature Platelet Fraction may be clinically indicated, consider  ordering this additional test [id number] CONSISTENT WITH PREVIOUS RESULT REPEATED TO VERIFY    nRBC 0.0 0.0 - 0.2 %    Comment: Performed at [facility], [place] [address] [place], [place] [address]  CK     Status: None   Collection Time: 09/30/21  2:52 AM  Result Value Ref Range   Total CK 50 49 - 397 U/L    Comment: Performed at [facility], [place] [address] [place], [place] [address]  Vitamin B12     Status: None   Collection Time: 09/30/21  2:52 AM  Result Value Ref Range   Vitamin B-12 518 180 - 914 pg/mL    Comment: (NOTE) This assay is not validated for testing neonatal or myeloproliferative syndrome specimens for Vitamin B12 levels. Performed at [facility], [place] [address], [place], [place] [address]   Folate, serum, performed at [facility]     Status: None   Collection Time: 09/30/21  2:52 AM  Result Value Ref Range   Folate 21.4 >5.9 ng/mL    Comment: Performed at [facility], [place] [address], [place], [place] [address]  Hemoglobin A1c     Status: Abnormal   Collection Time: 09/30/21  2:52 AM  Result Value Ref Range   Hgb A1c MFr Bld 11.3 (H) 4.8 - 5.6 %    Comment: (NOTE) Pre diabetes:          5.7%-6.4%  Diabetes:              >6.4%  Glycemic  control for   <7.0% adults with diabetes    Mean Plasma Glucose 277.61 mg/dL    Comment: Performed at [facility] [address] [place], [place] [address]  Lipid panel     Status: None   Collection Time: 09/30/21  2:52 AM  Result Value Ref Range   Cholesterol 163 0 - 200 mg/dL   Triglycerides 148 <150 mg/dL   HDL 48 >40 mg/dL   Total CHOL/HDL Ratio 3.4 RATIO   VLDL 30 0 - 40 mg/dL   LDL Cholesterol 85 0 - 99 mg/dL    Comment:        Total Cholesterol/HDL:CHD Risk Coronary Heart Disease Risk Table                     Men   Women  1/2 Average Risk   3.4   3.3  Average Risk       5.0   4.4  2 X Average Risk   9.6   7.1  3 X Average Risk  23.4   11.0        Use the calculated Patient Ratio above and the CHD Risk Table to determine the patient's CHD Risk.        ATP III CLASSIFICATION (LDL):  <100     mg/dL   Optimal  100-129  mg/dL   Near or Above                    Optimal  130-159  mg/dL   Borderline  160-189  mg/dL   High  >190     mg/dL   Very High Performed at [facility] [address], [place], [place] [address]   RPR     Status: None   Collection Time: 09/30/21  2:52 AM  Result Value Ref Range   RPR Ser Ql NON REACTIVE NON REACTIVE  Comment: Performed at [facility], [place] [address], [place], [place] [address]  Glucose, capillary     Status: Abnormal   Collection Time: 09/30/21  5:19 AM  Result Value Ref Range   Glucose-Capillary 287 (H) 70 - 99 mg/dL    Comment: Glucose reference range applies only to samples taken after fasting for at least 8 hours.  Urinalysis, Routine w reflex microscopic Urine, Clean Catch     Status: Abnormal   Collection Time: 09/30/21  6:41 AM  Result Value Ref Range   Color, Urine YELLOW YELLOW   APPearance CLEAR CLEAR   Specific Gravity, Urine 1.015 1.005 - 1.030   pH 5.5 5.0 - 8.0   Glucose, UA >=500 (A) NEGATIVE mg/dL   Hgb urine dipstick NEGATIVE NEGATIVE   Bilirubin Urine NEGATIVE NEGATIVE   Ketones,  ur NEGATIVE NEGATIVE mg/dL   Protein, ur NEGATIVE NEGATIVE mg/dL   Nitrite NEGATIVE NEGATIVE   Leukocytes,Ua NEGATIVE NEGATIVE    Comment: Performed at [facility] [address] [place], [place] [address]  Rapid urine drug screen (hospital performed)     Status: None   Collection Time: 09/30/21  6:41 AM  Result Value Ref Range   Opiates NONE DETECTED NONE DETECTED   Cocaine NONE DETECTED NONE DETECTED   Benzodiazepines NONE DETECTED NONE DETECTED   Amphetamines NONE DETECTED NONE DETECTED   Tetrahydrocannabinol NONE DETECTED NONE DETECTED   Barbiturates NONE DETECTED NONE DETECTED    Comment: (NOTE) DRUG SCREEN FOR MEDICAL PURPOSES ONLY.  IF CONFIRMATION IS NEEDED FOR ANY PURPOSE, NOTIFY LAB WITHIN 5 DAYS.  LOWEST DETECTABLE LIMITS FOR URINE DRUG SCREEN Drug Class                     Cutoff (ng/mL) Amphetamine and metabolites    1000 Barbiturate and metabolites    200 Benzodiazepine                 827 Tricyclics and metabolites     300 Opiates and metabolites        300 Cocaine and metabolites        300 THC                            50 Performed at [facility], [place] [address], [place], [place] [address]   Urinalysis, Microscopic (reflex)     Status: Abnormal   Collection Time: 09/30/21  6:41 AM  Result Value Ref Range   RBC / HPF 0-5 0 - 5 RBC/hpf   WBC, UA 0-5 0 - 5 WBC/hpf   Bacteria, UA RARE (A) NONE SEEN   Squamous Epithelial / LPF 0-5 0 - 5   Mucus PRESENT    Hyaline Casts, UA PRESENT     Comment: Performed at [facility], [place] [address] [place], [place] [address]  Glucose, capillary     Status: Abnormal   Collection Time: 09/30/21 12:22 PM  Result Value Ref Range   Glucose-Capillary 274 (H) 70 - 99 mg/dL    Comment: Glucose reference range applies only to samples taken after fasting for at least 8 hours.     ROS:  Pertinent items noted in HPI and remainder of comprehensive ROS otherwise negative.  Physical Exam: Vitals:    09/30/21 0943 09/30/21 0950  BP: (!) 73/53 126/72  Pulse: 74   Resp:    Temp:    SpO2: 97%  General:  well appearing WM- NAD at present sitting up on edge of bed-  family here as well HEENT: PERRLA, EOMI, mucous membranes dry  Neck: no JVD Heart: RRR Lungs:clear to auscultation bilaterally Abdomen: soft, non tender Extremities: no edema Skin: positive for skin tenting Neuro: alert, appropriate   Assessment/Plan: 60 year old WM with DM poorly controlled as well as poly pharmacy with elavil, neurontin and klonopin who presents with AKI in the setting of orthostatic hypotension 1.Renal- AKI in the setting of orthostatic hypotension on ACE-I and chlorthalidone.  Urine is completely bland-  he is non oliguric.  I suspect ATN from hypotension.  Agree with holding ACE and chlorthalidone and can continue hydration for now at 125 per hour-  I also encouraged drinking as much as he wants 2. Hypertension/volume  - still seems dry-  hence the positive orthostatics.  This is likely due to hyperglycemia causing polyuria and not keeping up with hydration needs-  getting IVF -  to continue.  Also need to continue to hold ACE-I for now 3. Hyponatremia-  not new-  could be an element of hypovolemic hyponatremia or the chlorthalidone vs elavil could be causing  ( both can cause SIADH) -  both on hold and getting hydration 4.  hypokalemia-  due to ACE being stopped and poor PO intake 5. Metabolic acidosis-  non gap-  could be RTA from DM-  dont think k reflects a more proximal RTA-  no specific treatment needed in my opinion with a bicarb over 20  6. falls  -  possibly due to first developing AKI-  then the neurontin, elavil and klonipin stayed around longer in his system.  He reports already better with holding those meds for only one day   Louis Meckel 09/30/2021, 1:50 PM

## 2021-09-30 NOTE — Progress Notes (Signed)
Initial Nutrition Assessment  DOCUMENTATION CODES:   Not applicable  INTERVENTION:  - Encourage PO intake  - MVI with minerals daily  - Ensure Enlive po BID, each supplement provides 350 kcal and 20 grams of protein  - Added "Plate Method" to AVS  NUTRITION DIAGNOSIS:   Increased nutrient needs related to chronic illness (liver cirrhosis, T2DM) as evidenced by estimated needs.  GOAL:   Patient will meet greater than or equal to 90% of their needs  MONITOR:   PO intake, Supplement acceptance, Weight trends, Labs  REASON FOR ASSESSMENT:   Malnutrition Screening Tool    ASSESSMENT:   Pt presents to hospital with recurrent falls, weakness and AMS likely secondary to hypotension. PMH includes T2DM, HLD, EtOH abuse, remote tobacco use, liver cirrhosis, barrett's esophagus.  Attempted to visit pt x2. SLP present doing bedside evaluation. Pt wears dentures and has had trouble with mastication but has cut foods to allow for easier ability of chewing. Per SLP, recommend regular diet at this time to allow for variety of food options. He complains of early satiety and lack of appetite since June 2022. He reports eating about 2 meals per day but only half of what he normally eats. Pt also speaking with diabetes coordinator at second visit. He reported to her that he enjoys foods such as Little Debbie snacks and sodas which he is willing to find alternatives to better manage his diabetes and cirrhosis. PTA he was not taking insulin but is going to start using the insulin pen.  Pt reports having a 100 lbs weight loss in the past year and a half. He states that he lost 30 lbs when he received his dentures because he had to adjust to them. Per review of chart, noted 14% weight loss since January 2022.  Suspect malnutrition is present, however unable to confirm at this time. Will continue to monitor.  Medications: SSI IV medications: IV thiamine  Labs: sodium 133, Potassium 3.4 (receiving  replacement), BUN 28, Cr 1.74  NUTRITION - FOCUSED PHYSICAL EXAM: Deferred to follow up-other providers present.  Diet Order:   Diet Order             Diet Carb Modified Fluid consistency: Thin; Room service appropriate? Yes  Diet effective now                   EDUCATION NEEDS:   Education needs have been addressed  Skin:  Skin Assessment: Reviewed RN Assessment  Last BM:  PTA  Height:   Ht Readings from Last 1 Encounters:  09/29/21 6' (1.829 m)    Weight:   Wt Readings from Last 1 Encounters:  09/29/21 94.5 kg    BMI:  Body mass index is 28.26 kg/m.  Estimated Nutritional Needs:   Kcal:  2400-2600  Protein:  120-140g  Fluid:  >/=2.4L  Drusilla Kanner, RDN, LDN Clinical Nutrition

## 2021-09-30 NOTE — Consult Note (Addendum)
Neurology Consultation  Reason for Consult: numbness in right leg, fall Referring Physician: A. Lilland, DO.   CC: fall at home. Right foot drop. Numbness.   History is obtained from: patient, chart.   HPI: Eric Ortega is a 60 y.o. male with a PMHx of ETOH abuse, liver cirrhosis, barrett's esophagitis, DM, HTN, and HLD. Neuro asked to see patient because of above. He had slurred speech for about a day and said he had difficulty answering questions due to word finding. He fell at home and hurt his nose. Blood was found around patient, but likely due to nose injury. No bowel or bladder incontinence. No tongue biting. He has never had a seizure before. No history of dementia, TBI, brain surgery, brain bleed, brain tumor. No previous history of events where he just stared into space.   He had a previous injury to his back at L4-5 and says he has noticed right foot drop for about 3 months, but did not seek care.   He ambulates at home without assistance. States right foot drop has interfered with walking as he is unable to put his toes and midfoot on the floor. He performs ADLs without assistance. Today, he says he has been OOB walking without dizziness. OT said something about his foot drop. He feels much better, not dizzy, and speech is back to normal for him. No further difficulty finding words.   NP asked him if he had DM neuropathy given his symptoms below the knee and his uncontrolled DM (A1c 11). He said "probably".   ROS: A robust ROS was performed and is negative except as noted in the HPI.   Past Medical History:  Diagnosis Date   Diabetes mellitus without complication (HCC)    Hyperlipidemia    Hypertension   GAD Barrett's Cirrhosis ETOH abuse  Family History  Problem Relation Age of Onset   Diabetes Mother    Heart failure Mother    Heart failure Father    Social History:   reports that he has quit smoking. He has never used smokeless tobacco. He reports that he does not  drink alcohol and does not use drugs. ETOH use  Medications  Current Facility-Administered Medications:    acetaminophen (TYLENOL) tablet 650 mg, 650 mg, Oral, Q6H PRN **OR** acetaminophen (TYLENOL) suppository 650 mg, 650 mg, Rectal, Q6H PRN, Princess Bruins, DO   atenolol (TENORMIN) tablet 25 mg, 25 mg, Oral, QHS, Lilland, Alana, DO, 25 mg at 09/29/21 2259   enoxaparin (LOVENOX) injection 40 mg, 40 mg, Subcutaneous, Q24H, Princess Bruins, DO, 40 mg at 09/30/21 0916   feeding supplement (ENSURE ENLIVE / ENSURE PLUS) liquid 237 mL, 237 mL, Oral, BID BM, Nestor Ramp, MD, 237 mL at 09/30/21 1547   [START ON 10/01/2021] influenza vac split quadrivalent PF (FLUARIX) injection 0.5 mL, 0.5 mL, Intramuscular, Tomorrow-1000, Nestor Ramp, MD   insulin aspart (novoLOG) injection 0-15 Units, 0-15 Units, Subcutaneous, TID WC, Espinoza, Alejandra, DO, 5 Units at 09/30/21 1646   insulin aspart (novoLOG) injection 0-5 Units, 0-5 Units, Subcutaneous, QHS, Espinoza, Alejandra, DO, 2 Units at 09/29/21 2356   lactated ringers infusion, , Intravenous, Continuous, Princess Bruins, DO, Stopped at 09/29/21 2310   lactated ringers infusion, , Intravenous, Continuous, Espinoza, Alejandra, DO, Last Rate: 125 mL/hr at 09/30/21 0649, New Bag at 09/30/21 0649   multivitamin with minerals tablet 1 tablet, 1 tablet, Oral, Daily, Nestor Ramp, MD, 1 tablet at 09/30/21 1546   thiamine 500mg  in normal saline (8ml) IVPB,  500 mg, Intravenous, TID, Lilland, Alana, DO, Last Rate: 100 mL/hr at 09/30/21 1549, 500 mg at 09/30/21 1549  Exam: Current vital signs: BP (!) 132/91 (BP Location: Right Arm)    Pulse 73    Temp 98.4 F (36.9 C) (Oral)    Resp 17    Ht 6' (1.829 m)    Wt 94.5 kg    SpO2 100%    BMI 28.26 kg/m  Vital signs in last 24 hours: Temp:  [97.7 F (36.5 C)-98.4 F (36.9 C)] 98.4 F (36.9 C) (12/26 1547) Pulse Rate:  [58-74] 73 (12/26 1547) Resp:  [13-17] 17 (12/26 1547) BP: (73-138)/(53-91) 132/91 (12/26  1547) SpO2:  [97 %-100 %] 100 % (12/26 1547) Weight:  [94.5 kg] 94.5 kg (12/25 2156)  PE: GENERAL: Very well appearing male. Awake, alert in NAD.  HEENT: normocephalic and atraumatic. LUNGS - Normal respiratory effort.  CV - RRR. ABDOMEN - Soft, nontender. Ext: warm, well perfused. Psych: affect light.   NEURO:  Mental Status: Alert and oriented x4 Speech/Language: speech is without dysarthria or aphasia.  Naming, repetition, fluency, and comprehension intact.  Cranial Nerves:  II: PERRL  mm/brisk. visual fields full.  III, IV, VI: EOMI. Lid elevation symmetric and full.  V: sensation is intact and symmetrical to face.  VII: Smile symmetrical without droop.   VIII:hearing intact to voice. IX, X: palate elevation is symmetric. Phonation normal.  XI: normal sternocleidomastoid and trapezius muscle strength. FXT:KWIOXB is symmetrical without fasciculations.   Motor:  RUE: grips  5/5       triceps 5/5     biceps  5/5      LUE: grips  5/5      triceps  5/5      biceps   5/5 RLE:  knee  5/5    thigh  /55      plantar flexion  5/5   dorsiflexion   4-/5 LLE:  knee  5/5   thigh  5/5    plantar flexion   5/5     dorsiflexion   5/5 Tone is normal. Bulk is normal.  Sensation- Intact to light touch bilaterally in all four extremities. Extinction absent to DSS.  Sensation is more dull in the right L 4-5 dermatome, but only below the knee.  Coordination: FTN intact bilaterally.  DTRs:  RUE:  biceps      brachioradialis  2    triceps RLE:  patella 0     tibial LUE:  biceps     brachioradialis 2    triceps LLE: patella  2   tibial Gait- deferred.  Labs I have reviewed labs in epic and the results pertinent to this consultation are:  CBC    Component Value Date/Time   WBC 5.1 09/30/2021 0252   RBC 4.22 09/30/2021 0252   HGB 12.7 (L) 09/30/2021 0252   HCT 34.9 (L) 09/30/2021 0252   PLT 78 (L) 09/30/2021 0252   MCV 82.7 09/30/2021 0252   MCH 30.1 09/30/2021 0252   MCHC 36.4 (H)  09/30/2021 0252   RDW 13.1 09/30/2021 0252   LYMPHSABS 1.3 09/29/2021 1305   MONOABS 0.5 09/29/2021 1305   EOSABS 0.4 09/29/2021 1305   BASOSABS 0.1 09/29/2021 1305   CMP     Component Value Date/Time   NA 133 (L) 09/30/2021 0252   K 3.4 (L) 09/30/2021 0252   CL 102 09/30/2021 0252   CO2 22 09/30/2021 0252   GLUCOSE 345 (H) 09/30/2021 0252   BUN  28 (H) 09/30/2021 0252   CREATININE 1.74 (H) 09/30/2021 0252   CALCIUM 8.8 (L) 09/30/2021 0252   PROT 5.8 (L) 09/30/2021 0252   ALBUMIN 3.2 (L) 09/30/2021 0252   AST 19 09/30/2021 0252   ALT 15 09/30/2021 0252   ALKPHOS 101 09/30/2021 0252   BILITOT 1.1 09/30/2021 0252   GFRNONAA 44 (L) 09/30/2021 0252   GFRAA >60 01/06/2018 1334    Lipid Panel     Component Value Date/Time   CHOL 163 09/30/2021 0252   TRIG 148 09/30/2021 0252   HDL 48 09/30/2021 0252   CHOLHDL 3.4 09/30/2021 0252   VLDL 30 09/30/2021 0252   LDLCALC 85 09/30/2021 0252   Imaging  MRI brain Normal study for age.  No acute finding  Assessment: 60 yo male here after a fall, thought to be secondary to orthostasis and AKI. He is weak in his R dorsiflexion and decreased sensation in right L4-5 dermatome, but not above the knee. Given his DM is so uncontrolled, he may have some neuropathy. However, a MRI L spine with and without contrast would be reasonable due to his history of back problems. He may need out patient EMG/NCS. Not suspicious for seizure. His word finding and slurred speech is very likely due to metabolic derangement however, he does have vascular risk factors and it may be prudent to place him on ASA 81mg  daily. Given his decreased sensations below the knee, DM neuropathy could be playing a role.   Recommendations/Plan:  -ASA 81mg  po qd.  -Fall could be multifactorial/polypharmacy.  -L4-5 RLE decreased sensation with back surgery there before. Check MRI L spine with contrast.  -Continue to correct orthostasis and AKI as this is high on differential for  falls.  -He needs improved control of DM II.  -Continue OT/PT.   Pt seen by , NP/Neuro and later by MD. Note/plan to be edited by MD as needed.  Pager:    The patient was seen and examined and the case was discussed with Jimmye Norman NP. Agree with plan as outlined in the note above.   Electronically signed by:  [id number], MD Page: Jimmye Norman 09/30/2021, 6:44 PM

## 2021-09-30 NOTE — Discharge Instructions (Addendum)
Thank you for allowing [place] to participate in your care!    You were admitted for confusion and episodes of falling.  You were evaluated by neurology as well as nephrology.  He had brain scans completed which did not show any abnormalities in your brain.  Neurology ordered a MRI of your low back and recommended follow-up with neurosurgery.  You can call and make an appointment with a neurosurgeon after discharge.    You did have issues with having extremely low blood pressures after standing so changes were made to your blood pressure medications.  At discharge I would like for you to continue atenolol but at a decreased dose of 25 mg daily.  We would like for you to stop taking your chlorthalidone as well as her amitriptyline.  We do not want you taking your lisinopril either but want you to follow-up with your primary care provider in the next week and consider restarting this at a lower dose.  Regarding your diabetes your hemoglobin A1c was elevated at 11.3.  We know that she had issues with the glimepiride sore switching to NovoLog 70/30.  He will take 10 units twice daily with a meal.  You will continue your metformin.  Be sure to keep track of your blood sugars and follow-up with your primary care provider to determine any adjustments that may need to be made.  Because of your diabetes it is also recommended that she be on a statin so atorvastatin 40 mg daily was started.  Please continue to take this.  I hope you have a happy new year!  If you experience worsening of your admission symptoms, develop shortness of breath, life threatening emergency, suicidal or homicidal thoughts you must seek medical attention immediately by calling 911 or calling your MD immediately  if symptoms less severe.   Plate Method for Diabetes   Foods with carbohydrates make your blood glucose level go up. The plate method is a simple way to meal plan and control the amount of carbohydrate you eat.         Use the  following guidance to build a healthy plate to control carbohydrates. Divide a 9-inch plate into 3 sections, and consider your beverage the 4th section of your meal: Food Group Examples of Foods/Beverages for This Section of your Meal  Section 1: Non-starchy vegetables Fill  of your plate to include non-starchy vegetables Asparagus, broccoli, brussels sprouts, cabbage, carrots, cauliflower, celery, cucumber, green beans, mushrooms, peppers, salad greens, tomatoes, or zucchini.  Section 2: Protein foods Fill  of your plate to include a lean protein Lean meat, poultry, fish, seafood, cheese, eggs, lean deli meat, tofu, beans, lentils, nuts or nut butters.  Section 3: Carbohydrate foods Fill  of your plate to include carbohydrate foods Whole grains, whole wheat bread, brown rice, whole grain pasta, polenta, corn tortillas, fruit, or starchy vegetables (potatoes, green peas, corn, beans, acorn squash, and butternut squash). One cup of milk also counts as a food that contains carbohydrate.  Section 4: Beverage Choose water or a low-calorie drink for your beverage. Unsweetened tea, coffee, or flavored/sparkling water without added sugar.  Image reprinted with permission from The American Diabetes Association.  Copyright 2022 by the American Diabetes Association.   Copyright 2022  Academy of Nutrition and Dietetics. All rights reserved

## 2021-09-30 NOTE — Progress Notes (Signed)
OT Cancellation Note  Patient Details Name: Harbert Fitterer MRN: [id number] DOB: Jun 16, 1961   Cancelled Treatment:    Reason Eval/Treat Not Completed: Patient at procedure or test/ unavailable (working with ST, then PT. Will reattempt later time)  Burnett Corrente Patric Vanpelt, OT/L   Acute OT Clinical Specialist Acute Rehabilitation Services Pager [phone] Office [phone]  09/30/2021, 1:25 PM

## 2021-09-30 NOTE — Plan of Care (Signed)
°  Problem: Education: °Goal: Knowledge of General Education information will improve °Description: Including pain rating scale, medication(s)/side effects and non-pharmacologic comfort measures °Outcome: Progressing °  °Problem: Health Behavior/Discharge Planning: °Goal: Ability to manage health-related needs will improve °Outcome: Progressing °  °Problem: Clinical Measurements: °Goal: Cardiovascular complication will be avoided °Outcome: Progressing °  °

## 2021-10-01 LAB — CBC
HCT: 33.5 % — ABNORMAL LOW (ref 39.0–52.0)
Hemoglobin: 12.2 g/dL — ABNORMAL LOW (ref 13.0–17.0)
MCH: 30.2 pg (ref 26.0–34.0)
MCHC: 36.4 g/dL — ABNORMAL HIGH (ref 30.0–36.0)
MCV: 82.9 fL (ref 80.0–100.0)
Platelets: 78 10*3/uL — ABNORMAL LOW (ref 150–400)
RBC: 4.04 MIL/uL — ABNORMAL LOW (ref 4.22–5.81)
RDW: 13.1 % (ref 11.5–15.5)
WBC: 4.3 10*3/uL (ref 4.0–10.5)
nRBC: 0 % (ref 0.0–0.2)

## 2021-10-01 LAB — BASIC METABOLIC PANEL
Anion gap: 11 (ref 5–15)
BUN: 23 mg/dL (ref 8–23)
CO2: 22 mmol/L (ref 22–32)
Calcium: 8.7 mg/dL — ABNORMAL LOW (ref 8.9–10.3)
Chloride: 104 mmol/L (ref 98–111)
Creatinine, Ser: 1.31 mg/dL — ABNORMAL HIGH (ref 0.61–1.24)
GFR, Estimated: >60 mL/min (ref >60)
Glucose, Bld: 255 mg/dL — ABNORMAL HIGH (ref 70–99)
Potassium: 3.5 mmol/L (ref 3.5–5.1)
Sodium: 137 mmol/L (ref 135–145)

## 2021-10-01 LAB — GLUCOSE, CAPILLARY
Glucose-Capillary: 291 mg/dL — ABNORMAL HIGH (ref 70–99)
Glucose-Capillary: 206 mg/dL — ABNORMAL HIGH (ref 70–99)
Glucose-Capillary: 179 mg/dL — ABNORMAL HIGH (ref 70–99)
Glucose-Capillary: 194 mg/dL — ABNORMAL HIGH (ref 70–99)

## 2021-10-01 MED ORDER — ATORVASTATIN CALCIUM 40 MG PO TABS
40.0000 mg | ORAL_TABLET | Freq: Every day | ORAL | Status: DC
  Administered 2021-10-01 – 2021-10-02 (×2): 40 mg via ORAL
  Filled 2021-10-01 (×2): qty 1

## 2021-10-01 MED ORDER — INSULIN ASPART PROT & ASPART (70-30 MIX) 100 UNIT/ML ~~LOC~~ SUSP
10.0000 [IU] | Freq: Two times a day (BID) | SUBCUTANEOUS | Status: DC
  Administered 2021-10-01 – 2021-10-02 (×3): 10 [IU] via SUBCUTANEOUS
  Filled 2021-10-01 (×2): qty 10

## 2021-10-01 NOTE — Progress Notes (Signed)
Inpatient Diabetes Program Recommendations  AACE/ADA: New Consensus Statement on Inpatient Glycemic Control (2015)  Target Ranges:  Prepandial:   less than 140 mg/dL      Peak postprandial:   less than 180 mg/dL (1-2 hours)      Critically ill patients:  140 - 180 mg/dL   Lab Results  Component Value Date   GLUCAP 206 (H) 10/01/2021   HGBA1C 11.3 (H) 09/30/2021    Review of Glycemic Control  Latest Reference Range & Units 10/01/21 07:00 10/01/21 11:30  Glucose-Capillary 70 - 99 mg/dL 601 (H) 093 (H)  (H): Data is abnormally high  Diabetes history: DM2 Outpatient Diabetes medications: Metformin 500 mg BID & Amaryl 2 mg QD Current orders for Inpatient glycemic control: 70/30 10 units BID  Inpatient Diabetes Program Recommendations:    Novolog 0-9 units TID and 0-5 units QHS  Will teach insulin today.  Will continue to follow while inpatient.  Thank you, Dulce Sellar, RN, BSN Diabetes Coordinator Inpatient Diabetes Program [phone] (team pager from 8a-5p)

## 2021-10-01 NOTE — Progress Notes (Signed)
FPTS Interim Night Progress Note  S:Patient sleeping comfortably.  Rounded with primary night RN.  No concerns voiced.  No orders required.    O: Today's Vitals   09/30/21 0943 09/30/21 0950 09/30/21 1547 09/30/21 2223  BP: (!) 73/53 126/72 (!) 132/91 122/64  Pulse: 74  73 73  Resp:   17 18  Temp:   98.4 F (36.9 C) 98.4 F (36.9 C)  TempSrc:   Oral Oral  SpO2: 97%  100%   Weight:      Height:      PainSc:  0-No pain  0-No pain      A/P: Continue current management  Dana Allan MD PGY-3, [facility] Family Medicine Service pager [phone]

## 2021-10-01 NOTE — Plan of Care (Signed)
Neurology Plan of Care  Imaging reviewed:  MRI brain wo contrast 12/25: IMPRESSION: Normal study for age.  No acute finding.  MRI lumbar spine wwo contrast 12/26: IMPRESSION: 1. Unchanged examination of the lumbar spine with severe bilateral L5-S1 neural foraminal stenosis. 2. Unchanged right lateral recess narrowing at L4-L5 and L5-S1 may serve as a source of radiculopathy. 3. Low-lying conus medullaris.   Assessment/Plan: Patient's fall etiology is likely multifactorial with MRI lumbar spine findings with right lateral recess narrowing at L4-L5 and L5-S1 which may server as a source of radiculopathy, possibly neuropathy, polypharmacy, or orthostasis and AKI contribution to patient's fall. Recommend neurosurgery consultation and evaluation, continue management of comorbid conditions per primary team as you are, continue PT/OT. Neurology will be available for further questions or concerns on an as-needed basis.   Plan discussed with attending neurologist, Dr. Thomasena Edis, who is in agreement.  Lanae Boast, AGACNP-BC [facility] [phone]

## 2021-10-01 NOTE — Progress Notes (Addendum)
Family Medicine Teaching Service Daily Progress Note Intern Pager: [phone]  Patient name: Eric Ortega Medical record number: [id number] Date of birth: 08-Nov-1960 Age: 60 y.o. Gender: male  Primary Care Provider: Evelena Peat, MD Consultants: Neurology, Nephrology Code Status: Full  Pt Overview and Major Events to Date:  11/25: Admitted 11/26: Nephrology and neurology consulted  Assessment and Plan: Eric Ortega is a 60 y.o. male with PMHx of T2DM, HLD, HTN, liver cirrhosis, barrett's esophagus who presented Fall, Weakness, and Altered Mental Status.   Acute encephalopathy, possible hypoperfusion event   Recurrent falls Orthostatic vitals were positive, patient was asymptomatic with PT when they were done but the drop in pressure was significant. MRI L spine with contrast which showed narrowing of L4-L5 and L5-S1 (both unchanged from prior).  At this point, I feel that these recurrent falls are multifactorial with neuropathy, polypharmacy, orthostasis and AKI.  Neurology recommended consideration for neurosurgery consult and evaluation. - Neurology and nephrology consulted - Consider neurosurgery consult - Closely monitor vital signs - Continue maintenance fluids and encourage oral intake - PT/OT eval and treat - Continue holding sedating medications such as clonazepam, amitriptyline, gabapentin   Hypotension in the setting of chronic HTN Patient's blood pressure remains normotensive this morning. Patient will likely discharge with minimal blood pressure-affecting medications. - Continue atenolol 25mg  daily - Continue to monitor BP with routine vitals   AKI, improving Creatinine 1.74>1.31.  Suspected ATN from hypotension, holding blood pressure medications at this time and continuing hydration. - Continue to monitor with BMP - Encourage hydration - Continue LR at 125 mL/hr   T2DM Patient received 23 Units of short acting insulin with sugars still elevated. Will start 70/30 for  cost and adjust as able to get an affordable home regimen. - continue CBG checks 4 times daily with meals and nightly - Discontinuing other sliding scale insulin - 70/30 10 units BID - Holding home metformin and sulfonylurea    HLD - Start Atorvastatin 40mg  daily   Liver cirrhosis   chronic thrombocytopenia   Anemia Possibly mixed picture of fatty liver and alcohol use. Hgb stable 12.2, platelets stable at 78. - Continue to monitor with CBC - Lovenox for DVT prophylaxis   GAD/MDD - Continuing to hold home medications as likely contributing to presentation     FEN/GI: Carb modified PPx: lovenox Dispo:Pending PT recommendations  pending clinical improvement . Barriers include continued medical work-up.   Subjective:  Patient reports that he is feeling somewhat better this morning.  He does note that he was not symptomatic with physical therapy when he worked with him even though his blood pressure did drop significantly.  Patient is wanting to know when he would be able to go home.  Objective: Temp:  [98 F (36.7 C)-98.4 F (36.9 C)] 98 F (36.7 C) (12/27 0757) Pulse Rate:  [73] 73 (12/27 0757) Resp:  [15-18] 15 (12/27 0757) BP: (119-132)/(64-91) 119/78 (12/27 0757) SpO2:  [95 %-100 %] 95 % (12/27 0757) Physical Exam: General: Chronically ill-appearing male, supine in bed, resting comfortably Cardiovascular: RRR, 2+ peripheral pulses Respiratory: Breathing comfortably on room air, no increased work of breathing, clear to auscultation bilateral Abdomen: Soft, nontender, nondistended Extremities: Able to move all extremities, no peripheral edema noted  Laboratory: Recent Labs  Lab 09/29/21 1305 09/30/21 0252 10/01/21 0356  WBC 5.2 5.1 4.3  HGB 13.1 12.7* 12.2*  HCT 35.9* 34.9* 33.5*  PLT 87* 78* 78*   Recent Labs  Lab 09/29/21 1305 09/30/21 0252 10/01/21 0356  NA 131* 133* 137  K 4.2 3.4* 3.5  CL 100 102 104  CO2 21* 22 22  BUN 27* 28* 23  CREATININE 1.76*  1.74* 1.31*  CALCIUM 8.8* 8.8* 8.7*  PROT 5.7* 5.8*  --   BILITOT 1.0 1.1  --   ALKPHOS 151* 101  --   ALT 13 15  --   AST 24 19  --   GLUCOSE 487* 345* 255*     Imaging/Diagnostic Tests: MR Lumbar Spine W Wo Contrast  Result Date: 09/30/2021 CLINICAL DATA:  Right foot drop EXAM: MRI LUMBAR SPINE WITHOUT AND WITH CONTRAST TECHNIQUE: Multiplanar and multiecho pulse sequences of the lumbar spine were obtained without and with intravenous contrast. CONTRAST:  8.68mL GADAVIST GADOBUTROL 1 MMOL/ML IV SOLN COMPARISON:  None. FINDINGS: Segmentation:  Standard. Alignment:  Physiologic. Vertebrae:  No fracture, evidence of discitis, or bone lesion. Conus medullaris and cauda equina: Conus extends to the L3 level. Conus and cauda equina appear normal. Paraspinal and other soft tissues: Negative Disc levels: L1-L2: Small left foraminal protrusion. No spinal canal stenosis. No neural foraminal stenosis. L2-L3: Normal disc space and facet joints. No spinal canal stenosis. No neural foraminal stenosis. L3-L4: Small right foraminal disc protrusion. No spinal canal stenosis. Mild right neural foraminal stenosis. L4-L5: Moderate facet hypertrophy with right asymmetric disc bulge, unchanged. Right lateral recess narrowing without central spinal canal stenosis. Unchanged mild bilateral neural foraminal stenosis. L5-S1: Small disc bulge, unchanged. Right lateral recess narrowing without central spinal canal stenosis. Unchanged severe bilateral neural foraminal stenosis. Visualized sacrum: Normal. IMPRESSION: 1. Unchanged examination of the lumbar spine with severe bilateral L5-S1 neural foraminal stenosis. 2. Unchanged right lateral recess narrowing at L4-L5 and L5-S1 may serve as a source of radiculopathy. 3. Low-lying conus medullaris. Electronically Signed   By: Deatra Robinson M.D.   On: 09/30/2021 21:46     Evelena Leyden, DO 10/01/2021, 9:46 AM PGY-2, [facility] FPTS Intern pager: [phone], text  pages welcome

## 2021-10-01 NOTE — Progress Notes (Signed)
Mobility Specialist Criteria Algorithm Info.   10/01/21 1530  Mobility  Activity Ambulated in hall (in chair before and after ambulation)  Range of Motion/Exercises Active;All extremities  Level of Assistance Modified independent, requires aide device or extra time  Assistive Device Other (Comment) (IV Pole)  Distance Ambulated (ft) 1625 ft  Mobility Ambulated with assistance in hallway  Mobility Response Tolerated well  Mobility performed by Mobility specialist  Bed Position Chair   Patient ambulated in hallway mod I with steady gait. Tolerated ambulation well without complaint or incident. Was left in chair with all needs met.      10/01/2021 4:14 PM

## 2021-10-01 NOTE — Progress Notes (Signed)
Subjective:  good UOP-  crt down to 1.3-  sodium up to 137 Objective Vital signs in last 24 hours: Vitals:   09/30/21 0950 09/30/21 1547 09/30/21 2223 10/01/21 0757  BP: 126/72 (!) 132/91 122/64 119/78  Pulse:  73 73 73  Resp:  17 18 15   Temp:  98.4 F (36.9 C) 98.4 F (36.9 C) 98 F (36.7 C)  TempSrc:  Oral Oral Oral  SpO2:  100%  95%  Weight:      Height:       Weight change:   Intake/Output Summary (Last 24 hours) at 10/01/2021 1157 Last data filed at 10/01/2021 0831 Gross per 24 hour  Intake 2851 ml  Output 750 ml  Net 2101 ml     Assessment/Plan: 60 year old WM with DM poorly controlled as well as poly pharmacy with elavil, neurontin and klonopin who presents with AKI in the setting of orthostatic hypotension 1.Renal- AKI in the setting of orthostatic hypotension on ACE-I and chlorthalidone.  Urine is completely bland-  he is non oliguric.  I suspect ATN from hypotension.  Agree with holding ACE and chlorthalidone - I encouraged drinking as much as he wants-  will have them check orthostatic vitals and if BP does not drop standing up will stop the IVF 2. Hypertension/volume  - still seemed dry yesterday with positive orthostatics.  This is likely due to hyperglycemia causing polyuria and not keeping up with hydration needs-  getting IVF -  check standing BP-  if does not drop will stop IVF.    Also would continue to hold ACE-I for now with aki 3. Hyponatremia-  not new-  could be an element of hypovolemic hyponatremia or the chlorthalidone vs elavil could be causing  ( both can cause SIADH) -  both on hold and getting hydration-  sodium normal today-  I would probably not resume chlorthalidone at discharge 4.  hypokalemia-  due to ACE being stopped and poor PO intake-  is OK  5. Metabolic acidosis-  non gap-  could be RTA from DM-  dont think k reflects a more proximal RTA-  no specific treatment needed in my opinion with a bicarb over 20  6. falls  -  possibly due to first  developing AKI-  then the neurontin, elavil and klonipin stayed around longer in his system.  He reports already better with holding those meds for only one day  Pt tells me that they told him he will go home tomorrow-  I would not send out on chlorthalidone.  If BP is high and renal function normal could resume ACE at low dose and let PCP titrate-  or just allos them to resume as OP.  And I would simplify his reg for neuropathy as well-  he does not need renal specific follow up-  renal will sign off     66    Labs: Basic Metabolic Panel: Recent Labs  Lab 09/29/21 1305 09/30/21 0252 10/01/21 0356  NA 131* 133* 137  K 4.2 3.4* 3.5  CL 100 102 104  CO2 21* 22 22  GLUCOSE 487* 345* 255*  BUN 27* 28* 23  CREATININE 1.76* 1.74* 1.31*  CALCIUM 8.8* 8.8* 8.7*   Liver Function Tests: Recent Labs  Lab 09/29/21 1305 09/30/21 0252  AST 24 19  ALT 13 15  ALKPHOS 151* 101  BILITOT 1.0 1.1  PROT 5.7* 5.8*  ALBUMIN 3.2* 3.2*   No results for input(s): LIPASE, AMYLASE in  the last 168 hours. Recent Labs  Lab 09/29/21 1305  AMMONIA 65*   CBC: Recent Labs  Lab 09/29/21 1305 09/30/21 0252 10/01/21 0356  WBC 5.2 5.1 4.3  NEUTROABS 2.9  --   --   HGB 13.1 12.7* 12.2*  HCT 35.9* 34.9* 33.5*  MCV 84.5 82.7 82.9  PLT 87* 78* 78*   Cardiac Enzymes: Recent Labs  Lab 09/29/21 1504 09/30/21 0252  CKTOTAL 56 50   CBG: Recent Labs  Lab 09/30/21 1222 09/30/21 1624 09/30/21 2229 10/01/21 0700 10/01/21 1130  GLUCAP 274* 232* 221* 291* 206*    Iron Studies: No results for input(s): IRON, TIBC, TRANSFERRIN, FERRITIN in the last 72 hours. Studies/Results: CT HEAD WO CONTRAST ( )  Result Date: 09/29/2021 CLINICAL DATA:  Multiple falls EXAM: CT HEAD WITHOUT CONTRAST CT CERVICAL SPINE WITHOUT CONTRAST TECHNIQUE: Multidetector CT imaging of the head and cervical spine was performed following the standard protocol without intravenous contrast. Multiplanar CT  image reconstructions of the cervical spine were also generated. COMPARISON:  11/07/2020 FINDINGS: CT HEAD FINDINGS Brain: No evidence of acute infarction, hemorrhage, hydrocephalus, extra-axial collection or mass lesion/mass effect. Vascular: No hyperdense vessel or unexpected calcification. Skull: Normal. Negative for fracture or focal lesion. Sinuses/Orbits: No acute finding. Other: None. CT CERVICAL SPINE FINDINGS Alignment: Normal. Skull base and vertebrae: No acute fracture. No primary bone lesion or focal pathologic process. Soft tissues and spinal canal: No prevertebral fluid or swelling. No visible canal hematoma. Disc levels: Mild to moderate multilevel disc space height loss and osteophytosis of the lower cervical levels. Upper chest: Negative. Other: None. IMPRESSION: 1. No acute intracranial pathology. 2. No fracture or static subluxation of the cervical spine. 3. Mild to moderate multilevel cervical disc degenerative disease. Electronically Signed   By: Jearld Lesch M.D.   On: 09/29/2021 13:43   CT Cervical Spine Wo Contrast  Result Date: 09/29/2021 CLINICAL DATA:  Multiple falls EXAM: CT HEAD WITHOUT CONTRAST CT CERVICAL SPINE WITHOUT CONTRAST TECHNIQUE: Multidetector CT imaging of the head and cervical spine was performed following the standard protocol without intravenous contrast. Multiplanar CT image reconstructions of the cervical spine were also generated. COMPARISON:  11/07/2020 FINDINGS: CT HEAD FINDINGS Brain: No evidence of acute infarction, hemorrhage, hydrocephalus, extra-axial collection or mass lesion/mass effect. Vascular: No hyperdense vessel or unexpected calcification. Skull: Normal. Negative for fracture or focal lesion. Sinuses/Orbits: No acute finding. Other: None. CT CERVICAL SPINE FINDINGS Alignment: Normal. Skull base and vertebrae: No acute fracture. No primary bone lesion or focal pathologic process. Soft tissues and spinal canal: No prevertebral fluid or swelling. No  visible canal hematoma. Disc levels: Mild to moderate multilevel disc space height loss and osteophytosis of the lower cervical levels. Upper chest: Negative. Other: None. IMPRESSION: 1. No acute intracranial pathology. 2. No fracture or static subluxation of the cervical spine. 3. Mild to moderate multilevel cervical disc degenerative disease. Electronically Signed   By: Jearld Lesch M.D.   On: 09/29/2021 13:43   MR BRAIN WO CONTRAST  Result Date: 09/29/2021 CLINICAL DATA:  Transient ischemic attack.  Confusion and weakness. EXAM: MRI HEAD WITHOUT CONTRAST TECHNIQUE: Multiplanar, multiecho pulse sequences of the brain and surrounding structures were obtained without intravenous contrast. COMPARISON:  Head CT same day FINDINGS: Brain: Diffusion imaging does not show any acute or subacute infarction. The brainstem and cerebellum are normal. Cerebral hemispheres show a few punctate foci of T2 and FLAIR signal within the frontal white matter, often seen at this age and not likely significant. No evidence  of widespread small-vessel disease. No cortical abnormality. No mass, hemorrhage, hydrocephalus or extra-axial collection. Vascular: Major vessels at the base of the brain show flow. Skull and upper cervical spine: Negative Sinuses/Orbits: Clear/normal Other: None IMPRESSION: Normal study for age.  No acute finding. Electronically Signed   By: Paulina Fusi M.D.   On: 09/29/2021 21:10   MR Lumbar Spine W Wo Contrast  Result Date: 09/30/2021 CLINICAL DATA:  Right foot drop EXAM: MRI LUMBAR SPINE WITHOUT AND WITH CONTRAST TECHNIQUE: Multiplanar and multiecho pulse sequences of the lumbar spine were obtained without and with intravenous contrast. CONTRAST:  8.75mL GADAVIST GADOBUTROL 1 MMOL/ML IV SOLN COMPARISON:  None. FINDINGS: Segmentation:  Standard. Alignment:  Physiologic. Vertebrae:  No fracture, evidence of discitis, or bone lesion. Conus medullaris and cauda equina: Conus extends to the L3 level. Conus  and cauda equina appear normal. Paraspinal and other soft tissues: Negative Disc levels: L1-L2: Small left foraminal protrusion. No spinal canal stenosis. No neural foraminal stenosis. L2-L3: Normal disc space and facet joints. No spinal canal stenosis. No neural foraminal stenosis. L3-L4: Small right foraminal disc protrusion. No spinal canal stenosis. Mild right neural foraminal stenosis. L4-L5: Moderate facet hypertrophy with right asymmetric disc bulge, unchanged. Right lateral recess narrowing without central spinal canal stenosis. Unchanged mild bilateral neural foraminal stenosis. L5-S1: Small disc bulge, unchanged. Right lateral recess narrowing without central spinal canal stenosis. Unchanged severe bilateral neural foraminal stenosis. Visualized sacrum: Normal. IMPRESSION: 1. Unchanged examination of the lumbar spine with severe bilateral L5-S1 neural foraminal stenosis. 2. Unchanged right lateral recess narrowing at L4-L5 and L5-S1 may serve as a source of radiculopathy. 3. Low-lying conus medullaris. Electronically Signed   By: Deatra Robinson M.D.   On: 09/30/2021 21:46   ECHOCARDIOGRAM COMPLETE  Result Date: 09/30/2021    ECHOCARDIOGRAM REPORT   Patient Name:   Eric Ortega Date of Exam: 09/30/2021 Medical Rec #:  [id number]   Height:       72.0 in Accession #:    [id number]  Weight:       208.3 lb Date of Birth:  [birth year]    BSA:          2.168 m Patient Age:    60 years    BP:           111/66 mmHg Patient Gender: M           HR:           68 bpm. Exam Location:  Inpatient Procedure: 2D Echo, Cardiac Doppler and Color Doppler Indications:    Syncope  History:        Patient has no prior history of Echocardiogram examinations.                 Signs/Symptoms:Hypotension.  Sonographer:    Vanetta Shawl Referring Phys: [id number] MATTHEW J TRIFAN IMPRESSIONS  1. Left ventricular ejection fraction, by estimation, is 60 to 65%. The left ventricle has normal function. The left ventricle has no regional  wall motion abnormalities. Left ventricular diastolic parameters are consistent with Grade I diastolic dysfunction (impaired relaxation).  2. Right ventricular systolic function is normal. The right ventricular size is normal.  3. The mitral valve is normal in structure. No evidence of mitral valve regurgitation. No evidence of mitral stenosis.  4. The aortic valve is normal in structure. Aortic valve regurgitation is not visualized. No aortic stenosis is present.  5. The inferior vena cava is normal in size with greater than 50% respiratory variability, suggesting  right atrial pressure of 3 mmHg. FINDINGS  Left Ventricle: Left ventricular ejection fraction, by estimation, is 60 to 65%. The left ventricle has normal function. The left ventricle has no regional wall motion abnormalities. The left ventricular internal cavity size was normal in size. There is  no left ventricular hypertrophy. Left ventricular diastolic parameters are consistent with Grade I diastolic dysfunction (impaired relaxation). Right Ventricle: The right ventricular size is normal. Right ventricular systolic function is normal. Left Atrium: Left atrial size was normal in size. Right Atrium: Right atrial size was normal in size. Pericardium: There is no evidence of pericardial effusion. Mitral Valve: The mitral valve is normal in structure. No evidence of mitral valve regurgitation. No evidence of mitral valve stenosis. Tricuspid Valve: The tricuspid valve is normal in structure. Tricuspid valve regurgitation is trivial. No evidence of tricuspid stenosis. Aortic Valve: The aortic valve is normal in structure. Aortic valve regurgitation is not visualized. No aortic stenosis is present. Aortic valve mean gradient measures 4.0 mmHg. Aortic valve peak gradient measures 8.1 mmHg. Aortic valve area, by VTI measures 2.71 cm. Pulmonic Valve: The pulmonic valve was not well visualized. Pulmonic valve regurgitation is not visualized. No evidence of  pulmonic stenosis. Aorta: The aortic root is normal in size and structure. Venous: The inferior vena cava is normal in size with greater than 50% respiratory variability, suggesting right atrial pressure of 3 mmHg. IAS/Shunts: No atrial level shunt detected by color flow Doppler.  LEFT VENTRICLE PLAX 2D LVOT diam:     2.00 cm     Diastology LV SV:         89          LV e' medial:    6.96 cm/s LV SV Index:   41          LV E/e' medial:  9.1 LVOT Area:     3.14 cm    LV e' lateral:   6.96 cm/s                            LV E/e' lateral: 9.1  LV Volumes (MOD) LV vol d, MOD A2C: 76.8 ml LV vol d, MOD A4C: 83.4 ml LV vol s, MOD A2C: 32.9 ml LV vol s, MOD A4C: 35.1 ml LV SV MOD A2C:     43.9 ml LV SV MOD A4C:     83.4 ml LV SV MOD BP:      46.7 ml RIGHT VENTRICLE RV Basal diam:  3.40 cm RV Mid diam:    3.30 cm RV S prime:     12.30 cm/s LEFT ATRIUM             Index        RIGHT ATRIUM           Index LA Vol (A2C):   42.2 ml 19.47 ml/m  RA Area:     15.40 cm LA Vol (A4C):   51.4 ml 23.73 ml/m  RA Volume:   34.70 ml  16.01 ml/m LA Biplane Vol: 53.7 ml 24.77 ml/m  AORTIC VALVE                    PULMONIC VALVE AV Area (Vmax):    2.70 cm     PV Vmax:       0.69 m/s AV Area (Vmean):   2.66 cm     PV Peak grad:  1.9 mmHg AV Area (VTI):  2.71 cm AV Vmax:           142.00 cm/s AV Vmean:          95.600 cm/s AV VTI:            0.328 m AV Peak Grad:      8.1 mmHg AV Mean Grad:      4.0 mmHg LVOT Vmax:         122.00 cm/s LVOT Vmean:        80.800 cm/s LVOT VTI:          0.283 m LVOT/AV VTI ratio: 0.86  AORTA Ao Root diam: 3.20 cm MITRAL VALVE MV Area (PHT): 3.48 cm    SHUNTS MV Decel Time: 218 msec    Systemic VTI:  0.28 m MV E velocity: 63.40 cm/s  Systemic Diam: 2.00 cm MV A velocity: 64.70 cm/s MV E/A ratio:  0.98 Olga Millers MD Electronically signed by Olga Millers MD Signature Date/Time: 09/30/2021/12:17:53 PM    Final    Medications: Infusions:  lactated ringers Stopped (09/29/21 2310)   lactated  ringers 125 mL/hr at 10/01/21 1610    Scheduled Medications:  atenolol  25 mg Oral QHS   atorvastatin  40 mg Oral Daily   enoxaparin (LOVENOX) injection  40 mg Subcutaneous Q24H   feeding supplement  237 mL Oral BID BM   influenza vac split quadrivalent PF  0.5 mL Intramuscular Tomorrow-1000   insulin aspart protamine- aspart  10 Units Subcutaneous BID WC   multivitamin with minerals  1 tablet Oral Daily    have reviewed scheduled and prn medications.  Physical Exam: General:  NAD-  ordering lunch Heart: RRR Lungs: clear Abdomen: soft, non tender Extremities: no edema   10/01/2021,11:57 AM  LOS: 1 day

## 2021-10-02 LAB — CBC
HCT: 33.2 % — ABNORMAL LOW (ref 39.0–52.0)
Hemoglobin: 11.9 g/dL — ABNORMAL LOW (ref 13.0–17.0)
MCH: 29.8 pg (ref 26.0–34.0)
MCHC: 35.8 g/dL (ref 30.0–36.0)
MCV: 83.2 fL (ref 80.0–100.0)
Platelets: 70 10*3/uL — ABNORMAL LOW (ref 150–400)
RBC: 3.99 MIL/uL — ABNORMAL LOW (ref 4.22–5.81)
RDW: 12.8 % (ref 11.5–15.5)
WBC: 4.2 10*3/uL (ref 4.0–10.5)
nRBC: 0 % (ref 0.0–0.2)

## 2021-10-02 LAB — GLUCOSE, CAPILLARY
Glucose-Capillary: 180 mg/dL — ABNORMAL HIGH (ref 70–99)
Glucose-Capillary: 252 mg/dL — ABNORMAL HIGH (ref 70–99)

## 2021-10-02 LAB — BASIC METABOLIC PANEL
Anion gap: 7 (ref 5–15)
BUN: 18 mg/dL (ref 8–23)
CO2: 25 mmol/L (ref 22–32)
Calcium: 8.9 mg/dL (ref 8.9–10.3)
Chloride: 102 mmol/L (ref 98–111)
Creatinine, Ser: 0.97 mg/dL (ref 0.61–1.24)
GFR, Estimated: >60 mL/min (ref >60)
Glucose, Bld: 202 mg/dL — ABNORMAL HIGH (ref 70–99)
Potassium: 3.5 mmol/L (ref 3.5–5.1)
Sodium: 134 mmol/L — ABNORMAL LOW (ref 135–145)

## 2021-10-02 LAB — VITAMIN B1: Vitamin B1 (Thiamine): 166.7 nmol/L (ref 66.5–200.0)

## 2021-10-02 MED ORDER — ATENOLOL 25 MG PO TABS: 25.0000 mg | ORAL_TABLET | Freq: Every day | ORAL | 0 refills | Status: DC

## 2021-10-02 MED ORDER — INSULIN ASPART PROT & ASPART (70-30 MIX) 100 UNIT/ML ~~LOC~~ SUSP: 10.0000 [IU] | Freq: Two times a day (BID) | SUBCUTANEOUS | 11 refills | Status: DC

## 2021-10-02 MED ORDER — ATORVASTATIN CALCIUM 40 MG PO TABS: 40.0000 mg | ORAL_TABLET | Freq: Every day | ORAL | 0 refills | Status: DC

## 2021-10-02 NOTE — Progress Notes (Signed)
Inpatient Diabetes Program Recommendations  AACE/ADA: New Consensus Statement on Inpatient Glycemic Control (2015)  Target Ranges:  Prepandial:   less than 140 mg/dL      Peak postprandial:   less than 180 mg/dL (1-2 hours)      Critically ill patients:  140 - 180 mg/dL   Lab Results  Component Value Date   GLUCAP 252 (H) 10/02/2021   HGBA1C 11.3 (H) 09/30/2021    Review of Glycemic Control   Current orders for Inpatient glycemic control: 70/30 10 units BID  Spoke with patient at bedside.  Educated patient on insulin pen use at home. Reviewed contents of insulin flexpen starter kit. Reviewed all steps of insulin pen including attachment of needle, 2-unit air shot, dialing up dose, giving injection, removing needle, disposal of sharps, storage of unused insulin, disposal of insulin etc. Patient able to provide successful return demonstration. Also reviewed troubleshooting with insulin pen. MD to give patient Rxs for insulin pens and insulin pen needles.  Reviewed hypoglycemia, signs, symptoms and treatment.  Reviewed 70/30 insulin along with when to administer.  Asked him to check CBGs fasting and before bed.  He should have a small snack if CBG is <100 mg/dl at bedtime.  He is aware he can purchase Novolin ReiOn 70/30 insulin at [facility] for $43 for a box of 5 pens.    For DC: 70/30 Flexpen Order # [id number] Insulin pen needles Order # [id number]  Will continue to follow while inpatient.  Thank you, Reche Dixon, RN, BSN Diabetes Coordinator Inpatient Diabetes Program [phone] (team pager from 8a-5p)

## 2021-10-02 NOTE — Progress Notes (Signed)
Discharge information given to patient including his follow up, medication and home care etc, He verbalized of understanding.

## 2021-10-02 NOTE — Progress Notes (Signed)
Physical Therapy Treatment Patient Details Name: Eric Ortega MRN: [id number] DOB: 08-27-1961 Today's Date: 10/02/2021   History of Present Illness Patient is a 60 y/o male who presents on 09/29/21 with confusion, hypotension, weakness, left facial droop and unilateral weakness with 4 falls in last few days. Found to have acute toxic/metabolic encephalopathy, possible hypoperfusion event, AKI, hyperglycemia and increased ammonia. Brain MRI and Head CT-unremarkable. PMH includes DM, liver cirrhosis, barrett's esophagus and HTN.    PT Comments    Pt tolerates treatment well, ambulating for community distances. Pt does demonstrate drift with mobility initially and continues to have R foot drop. Pt will benefit from continued frequent mobilization to improve activity tolerance. PT recommends outpatient PT to aide in dynamic balance training. PT also recommends the pt visit an orthotist to assess for RLE AFO.   Recommendations for follow up therapy are one component of a multi-disciplinary discharge planning process, led by the attending physician.  Recommendations may be updated based on patient status, additional functional criteria and insurance authorization.  Follow Up Recommendations  Outpatient PT     Assistance Recommended at Discharge Intermittent Supervision/Assistance  Equipment Recommendations  None recommended by PT    Recommendations for Other Services       Precautions / Restrictions Precautions Precautions: Fall Precaution Comments: R foot drop Restrictions Weight Bearing Restrictions: No     Mobility  Bed Mobility               General bed mobility comments: Pt up in chair upon arrival, reports getting to chair independently    Transfers Overall transfer level: Independent Equipment used: None Transfers: Sit to/from Stand Sit to Stand: Supervision                Ambulation/Gait Ambulation/Gait assistance: Supervision Gait Distance (Feet): 1000  Feet Assistive device: None Gait Pattern/deviations: Step-through pattern;Drifts right/left Gait velocity: functional Gait velocity interpretation: >2.62 ft/sec, indicative of community ambulatory   General Gait Details: pt with increased lateral drift initially, improves with continued ambulation. Pt with R foot drop, no instances of loss of balance   Stairs             Wheelchair Mobility    Modified Rankin (Stroke Patients Only)       Balance Overall balance assessment: History of Falls;Needs assistance Sitting-balance support: No upper extremity supported;Feet supported Sitting balance-Leahy Scale: Good     Standing balance support: During functional activity Standing balance-Leahy Scale: Good                              Cognition Arousal/Alertness: Awake/alert Behavior During Therapy: WFL for tasks assessed/performed Overall Cognitive Status: Within Functional Limits for tasks assessed                                          Exercises Other Exercises Other Exercises: PT reinforces education on exercise and precautions for limiting risk of possible orthostatic hypotension    General Comments General comments (skin integrity, edema, etc.): pt denies symptoms with mobility, negative orthostatics with OT this morning      Pertinent Vitals/Pain Pain Assessment: No/denies pain    Home Living                          Prior Function  PT Goals (current goals can now be found in the care plan section) Acute Rehab PT Goals Patient Stated Goal: to get better, stop falling Progress towards PT goals: Progressing toward goals    Frequency    Min 3X/week      PT Plan Current plan remains appropriate    Co-evaluation              AM-PAC PT "6 Clicks" Mobility   Outcome Measure  Help needed turning from your back to your side while in a flat bed without using bedrails?: None Help needed moving  from lying on your back to sitting on the side of a flat bed without using bedrails?: None Help needed moving to and from a bed to a chair (including a wheelchair)?: A Little Help needed standing up from a chair using your arms (e.g., wheelchair or bedside chair)?: A Little Help needed to walk in hospital room?: A Little Help needed climbing 3-5 steps with a railing? : A Little 6 Click Score: 20    End of Session   Activity Tolerance: Patient tolerated treatment well Patient left: in chair;with call bell/phone within reach Nurse Communication: Mobility status PT Visit Diagnosis: History of falling (Z91.81);Difficulty in walking, not elsewhere classified (R26.2)     Time: 3009-2330 PT Time Calculation (min) (ACUTE ONLY): 23 min  Charges:  $Gait Training: 8-22 mins $Therapeutic Activity: 8-22 mins                     Arlyss Gandy, PT, DPT Acute Rehabilitation Pager: [phone] Office [phone]    Arlyss Gandy 10/02/2021, 12:48 PM

## 2021-10-02 NOTE — Progress Notes (Signed)
OT Cancellation Note  Patient Details Name: Eric Ortega MRN: [id number] DOB: March 22, 1961   Cancelled Treatment:    Reason Eval/Treat Not Completed: Patient declined, no reason specified.Pt just received breakfast, prefers to work with therapy later on today. Will reattempt as schedule allows.  Alfonzo Beers, OTD, OTR/L Acute Rehab [phone]   Mayer Masker 10/02/2021, 8:52 AM

## 2021-10-02 NOTE — Discharge Summary (Signed)
[facility] Discharge Summary  Patient name: Eric Ortega Medical record number: [id number] Date of birth: [birth year] Age: 60 y.o. Gender: male Date of Admission: 09/29/2021  Date of Discharge: 10/02/2021 Admitting Physician: Rise Patience, DO  Primary Care Provider: Cathlyn Parsons, MD Consultants: Neurology, nephrology  Indication for Hospitalization: Acute encephalopathy, recurrent falls  Discharge Diagnoses/Problem List:  Acute encephalopathy, possible hypoperfusion event Recurrent falls Hypotension in the setting of chronic hypertension AKI T2DM HLD Liver cirrhosis Chronic thrombocytopenia Anemia GAD/MDD  Disposition: Home  Discharge Condition: Improved  Discharge Exam:  General: Well-appearing 60 year old male sitting in chair next to hospital bed.  Reports he has walked in the hall several times this morning Cardiac: Regular rate and rhythm, no murmurs appreciated Respiratory: Normal work of breathing, lungs clear to auscultation bilaterally Abdomen: Soft, nontender, positive bowel sounds MSK: No gross abnormalities, no edema noted  Brief Hospital Course:  Eric Ortega is a 60 y.o. male that presented with recurrent falls, weakness and AMS. He has a PMHx of T2DM, HLD, HTN, liver cirrhosis. Below is his hospital course listed by problem, refer to the H&P for additional information.   Acute Encephalopathy  Recurrent Falls  Hypotensive on presentation to 87/57 but awake, alert and oriented. Initial neurological examination notable for scanning speech and word finding difficulties. Initial labs notable for ammonia 65. Non-contrast head CT and cervical spine CT negative for acute findings. MRI brain also negative. 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  PT evaluated the patient and performed orthostatic vital signs which showed a significant drop in the patient's blood pressures when going  from sitting to standing.  Feel that this acute encephalopathy may have been related to possible hypoperfusion event.  Neurology was consulted who did recommend an MRI spine which showed L4-L5 and L5-S1 narrowing (which were stable from prior scans).  They did recommend neurosurgery consult outpatient.  Patient continue to work with physical therapy who recommended bracing for foot drop and outpatient PT.    Nonoliguric Pre-renal AKI  Creatinine 1.76 on admission (baseline 0.7-0.8). Improved with fluid administration. Creatinine on discharge 0.97  HTN Presented with hypotension 87/57. Home atenolol 100 mg was reduced to 25 mg on admission to avoid rebound tachycardia with discontinuance. Given AKI, Lisinopril was held. Blood pressures stabilized.  Patient's atenolol was continued at 25 mg at discharge.  Nephrology was consulted who provided recommendations.  Patient's blood pressures normalized and or even slightly elevated prior to discharge.  He was discharged on 25 mg atenolol daily.  Nephrology recommended against restarting chlorthalidone but encouraged evaluation by PCP and possibly restarting ACE inhibitor at low-dose and titrating up.  Hyperglycemia   T2DM Glucose on BMP was 487 in ED. Hgb A1c 11.3. Home medications included metformin 1041m BID and glimepiride. Given patient's AKI on arrival, metformin and glimepiride were held. He was started on SSI.    Thrombocytopenia  Platelets 87k on admission. Baseline appears to be around 84k.   GAD/MDD Home medications include Celexa 40 mg daily, amitriptyline 100 mg daily, clonazepam 0.5 mg as needed. All medications were held on admission.  The Celexa was restarted at discharge but the amitriptyline and the clonazepam were discontinued.  Patient reports that he has had issues titrating off of the amitriptyline in the past but is willing to try again.  He will work with his primary care provider on this.  Unintentional Weight Loss  Reported 65 lbs  of unintentional weight loss in 7-8  months.   Follow Up Issues:   Ensure up to date on age-appropriate cancer screening. History of unintentional weight loss.  Chronic thrombocytopenia Patient was discharged without restarting ACE inhibitor.  Evaluate blood pressures in the outpatient setting and continue restarting at a low dose Amitriptyline was discontinued during hospitalization.  Patient reports he feels fatigued from the discontinuation but is willing to continue at this time.  Reassess in the outpatient setting. 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  Order placed to [facility] clinic so ensure patient has gotten this prosthesis  Significant Procedures:  None  Significant Labs and Imaging:  Recent Labs  Lab 09/30/21 0252 10/01/21 0356 10/02/21 0229  WBC 5.1 4.3 4.2  HGB 12.7* 12.2* 11.9*  HCT 34.9* 33.5* 33.2*  PLT 78* 78* 70*   Recent Labs  Lab 09/29/21 1305 09/30/21 0252 10/01/21 0356 10/02/21 0229  NA 131* 133* 137 134*  K 4.2 3.4* 3.5 3.5  CL 100 102 104 102  CO2 21* _0 GLUCOSE 487* 345* 255* 202*  BUN 27* 28* 23 18  CREATININE 1.76* 1.74* 1.31* 0.97  CALCIUM 8.8* 8.8* 8.7* 8.9  ALKPHOS 151* 101  --   --   AST 24 19  --   --   ALT 13 15  --   --   ALBUMIN 3.2* 3.2*  --   --     CT HEAD WO CONTRAST (5MM)  Result Date: 09/29/2021 CLINICAL DATA:  Multiple falls EXAM: CT HEAD WITHOUT CONTRAST CT CERVICAL SPINE WITHOUT CONTRAST TECHNIQUE: Multidetector CT imaging of the head and cervical spine was performed following the standard protocol without intravenous contrast. Multiplanar CT image reconstructions of the cervical spine were also generated. COMPARISON:  11/07/2020 FINDINGS: CT HEAD FINDINGS Brain: No evidence of acute infarction, hemorrhage, hydrocephalus, extra-axial collection or mass lesion/mass effect. Vascular: No hyperdense vessel or unexpected calcification.  Skull: Normal. Negative for fracture or focal lesion. Sinuses/Orbits: No acute finding. Other: None. CT CERVICAL SPINE FINDINGS Alignment: Normal. Skull base and vertebrae: No acute fracture. No primary bone lesion or focal pathologic process. Soft tissues and spinal canal: No prevertebral fluid or swelling. No visible canal hematoma. Disc levels: Mild to moderate multilevel disc space height loss and osteophytosis of the lower cervical levels. Upper chest: Negative. Other: None. IMPRESSION: 1. No acute intracranial pathology. 2. No fracture or static subluxation of the cervical spine. 3. Mild to moderate multilevel cervical disc degenerative disease. Electronically Signed   By: Delanna Ahmadi M.D.   On: 09/29/2021 13:43   CT Cervical Spine Wo Contrast  Result Date: 09/29/2021 CLINICAL DATA:  Multiple falls EXAM: CT HEAD WITHOUT CONTRAST CT CERVICAL SPINE WITHOUT CONTRAST TECHNIQUE: Multidetector CT imaging of the head and cervical spine was performed following the standard protocol without intravenous contrast. Multiplanar CT image reconstructions of the cervical spine were also generated. COMPARISON:  11/07/2020 FINDINGS: CT HEAD FINDINGS Brain: No evidence of acute infarction, hemorrhage, hydrocephalus, extra-axial collection or mass lesion/mass effect. Vascular: No hyperdense vessel or unexpected calcification. Skull: Normal. Negative for fracture or focal lesion. Sinuses/Orbits: No acute finding. Other: None. CT CERVICAL SPINE FINDINGS Alignment: Normal. Skull base and vertebrae: No acute fracture. No primary bone lesion or focal pathologic process. Soft tissues and spinal canal: No prevertebral fluid or swelling. No visible canal hematoma. Disc levels: Mild to moderate multilevel disc space height loss and osteophytosis of the lower cervical levels. Upper chest: Negative. Other: None.  IMPRESSION: 1. No acute intracranial pathology. 2. No fracture or static subluxation of the cervical spine. 3. Mild to  moderate multilevel cervical disc degenerative disease. Electronically Signed   By: Delanna Ahmadi M.D.   On: 09/29/2021 13:43   MR BRAIN WO CONTRAST  Result Date: 09/29/2021 CLINICAL DATA:  Transient ischemic attack.  Confusion and weakness. EXAM: MRI HEAD WITHOUT CONTRAST TECHNIQUE: Multiplanar, multiecho pulse sequences of the brain and surrounding structures were obtained without intravenous contrast. COMPARISON:  Head CT same day FINDINGS: Brain: Diffusion imaging does not show any acute or subacute infarction. The brainstem and cerebellum are normal. Cerebral hemispheres show a few punctate foci of T2 and FLAIR signal within the frontal white matter, often seen at this age and not likely significant. No evidence of widespread small-vessel disease. No cortical abnormality. No mass, hemorrhage, hydrocephalus or extra-axial collection. Vascular: Major vessels at the base of the brain show flow. Skull and upper cervical spine: Negative Sinuses/Orbits: Clear/normal Other: None IMPRESSION: Normal study for age.  No acute finding. Electronically Signed   By: Nelson Chimes M.D.   On: 09/29/2021 21:10   MR Lumbar Spine W Wo Contrast  Result Date: 09/30/2021 CLINICAL DATA:  Right foot drop EXAM: MRI LUMBAR SPINE WITHOUT AND WITH CONTRAST TECHNIQUE: Multiplanar and multiecho pulse sequences of the lumbar spine were obtained without and with intravenous contrast. CONTRAST:  8.81m GADAVIST GADOBUTROL 1 MMOL/ML IV SOLN COMPARISON:  None. FINDINGS: Segmentation:  Standard. Alignment:  Physiologic. Vertebrae:  No fracture, evidence of discitis, or bone lesion. Conus medullaris and cauda equina: Conus extends to the L3 level. Conus and cauda equina appear normal. Paraspinal and other soft tissues: Negative Disc levels: L1-L2: Small left foraminal protrusion. No spinal canal stenosis. No neural foraminal stenosis. L2-L3: Normal disc space and facet joints. No spinal canal stenosis. No neural foraminal stenosis. L3-L4:  Small right foraminal disc protrusion. No spinal canal stenosis. Mild right neural foraminal stenosis. L4-L5: Moderate facet hypertrophy with right asymmetric disc bulge, unchanged. Right lateral recess narrowing without central spinal canal stenosis. Unchanged mild bilateral neural foraminal stenosis. L5-S1: Small disc bulge, unchanged. Right lateral recess narrowing without central spinal canal stenosis. Unchanged severe bilateral neural foraminal stenosis. Visualized sacrum: Normal. IMPRESSION: 1. Unchanged examination of the lumbar spine with severe bilateral L5-S1 neural foraminal stenosis. 2. Unchanged right lateral recess narrowing at L4-L5 and L5-S1 may serve as a source of radiculopathy. 3. Low-lying conus medullaris. Electronically Signed   By: KUlyses JarredM.D.   On: 09/30/2021 21:46   ECHOCARDIOGRAM COMPLETE  Result Date: 09/30/2021    ECHOCARDIOGRAM REPORT   Patient Name:   CCLOYGARNER Date of Exam: 09/30/2021 Medical Rec #:  [id number]  Height:       72.0 in Accession #:    [id number] Weight:       208.3 lb Date of Birth:  3Jul 06, 1962   BSA:          2.168 m Patient Age:    89years    BP:           111/66 mmHg Patient Gender: M           HR:           68 bpm. Exam Location:  Inpatient Procedure: 2D Echo, Cardiac Doppler and Color Doppler Indications:    Syncope  History:        Patient has no prior history of Echocardiogram examinations.  Signs/Symptoms:Hypotension.  Sonographer:    Glo Herring Referring Phys: [id number] [place]  1. Left ventricular ejection fraction, by estimation, is 60 to 65%. The left ventricle has normal function. The left ventricle has no regional wall motion abnormalities. Left ventricular diastolic parameters are consistent with Grade I diastolic dysfunction (impaired relaxation).  2. Right ventricular systolic function is normal. The right ventricular size is normal.  3. The mitral valve is normal in structure. No evidence of mitral  valve regurgitation. No evidence of mitral stenosis.  4. The aortic valve is normal in structure. Aortic valve regurgitation is not visualized. No aortic stenosis is present.  5. The inferior vena cava is normal in size with greater than 50% respiratory variability, suggesting right atrial pressure of 3 mmHg. FINDINGS  Left Ventricle: Left ventricular ejection fraction, by estimation, is 60 to 65%. The left ventricle has normal function. The left ventricle has no regional wall motion abnormalities. The left ventricular internal cavity size was normal in size. There is  no left ventricular hypertrophy. Left ventricular diastolic parameters are consistent with Grade I diastolic dysfunction (impaired relaxation). Right Ventricle: The right ventricular size is normal. Right ventricular systolic function is normal. Left Atrium: Left atrial size was normal in size. Right Atrium: Right atrial size was normal in size. Pericardium: There is no evidence of pericardial effusion. Mitral Valve: The mitral valve is normal in structure. No evidence of mitral valve regurgitation. No evidence of mitral valve stenosis. Tricuspid Valve: The tricuspid valve is normal in structure. Tricuspid valve regurgitation is trivial. No evidence of tricuspid stenosis. Aortic Valve: The aortic valve is normal in structure. Aortic valve regurgitation is not visualized. No aortic stenosis is present. Aortic valve mean gradient measures 4.0 mmHg. Aortic valve peak gradient measures 8.1 mmHg. Aortic valve area, by VTI measures 2.71 cm. Pulmonic Valve: The pulmonic valve was not well visualized. Pulmonic valve regurgitation is not visualized. No evidence of pulmonic stenosis. Aorta: The aortic root is normal in size and structure. Venous: The inferior vena cava is normal in size with greater than 50% respiratory variability, suggesting right atrial pressure of 3 mmHg. IAS/Shunts: No atrial level shunt detected by color flow Doppler.  LEFT VENTRICLE  PLAX 2D LVOT diam:     2.00 cm     Diastology LV SV:         89          LV e' medial:    6.96 cm/s LV SV Index:   41          LV E/e' medial:  9.1 LVOT Area:     3.14 cm    LV e' lateral:   6.96 cm/s                            LV E/e' lateral: 9.1  LV Volumes (MOD) LV vol d, MOD A2C: 76.8 ml LV vol d, MOD A4C: 83.4 ml LV vol s, MOD A2C: 32.9 ml LV vol s, MOD A4C: 35.1 ml LV SV MOD A2C:     43.9 ml LV SV MOD A4C:     83.4 ml LV SV MOD BP:      46.7 ml RIGHT VENTRICLE RV Basal diam:  3.40 cm RV Mid diam:    3.30 cm RV S prime:     12.30 cm/s LEFT ATRIUM             Index  RIGHT ATRIUM           Index LA Vol (A2C):   42.2 ml 19.47 ml/m  RA Area:     15.40 cm LA Vol (A4C):   51.4 ml 23.73 ml/m  RA Volume:   34.70 ml  16.01 ml/m LA Biplane Vol: 53.7 ml 24.77 ml/m  AORTIC VALVE                    PULMONIC VALVE AV Area (Vmax):    2.70 cm     PV Vmax:       0.69 m/s AV Area (Vmean):   2.66 cm     PV Peak grad:  1.9 mmHg AV Area (VTI):     2.71 cm AV Vmax:           142.00 cm/s AV Vmean:          95.600 cm/s AV VTI:            0.328 m AV Peak Grad:      8.1 mmHg AV Mean Grad:      4.0 mmHg LVOT Vmax:         122.00 cm/s LVOT Vmean:        80.800 cm/s LVOT VTI:          0.283 m LVOT/AV VTI ratio: 0.86  AORTA Ao Root diam: 3.20 cm MITRAL VALVE MV Area (PHT): 3.48 cm    SHUNTS MV Decel Time: 218 msec    Systemic VTI:  0.28 m MV E velocity: 63.40 cm/s  Systemic Diam: 2.00 cm MV A velocity: 64.70 cm/s MV E/A ratio:  0.98 Kirk Ruths MD Electronically signed by Kirk Ruths MD Signature Date/Time: 09/30/2021/12:17:53 PM    Final      Results/Tests Pending at Time of Discharge: None  Discharge Medications:  Allergies as of 10/02/2021   No Known Allergies      Medication List     STOP taking these medications    amitriptyline 100 MG tablet Commonly known as: ELAVIL   chlorthalidone 25 MG tablet Commonly known as: HYGROTON   clonazePAM 0.5 MG tablet Commonly known as: KLONOPIN    gabapentin 600 MG tablet Commonly known as: NEURONTIN   glimepiride 2 MG tablet Commonly known as: AMARYL   lisinopril 30 MG tablet Commonly known as: ZESTRIL       TAKE these medications    atenolol 25 MG tablet Commonly known as: TENORMIN Take 1 tablet (25 mg total) by mouth at bedtime. What changed:  medication strength how much to take when to take this   atorvastatin 40 MG tablet Commonly known as: LIPITOR Take 1 tablet (40 mg total) by mouth daily. Start taking on: October 03, 2021   insulin aspart protamine- aspart (70-30) 100 UNIT/ML injection Commonly known as: NOVOLOG MIX 70/30 Inject 0.1 mLs (10 Units total) into the skin 2 (two) times daily with a meal.   metFORMIN 500 MG tablet Commonly known as: GLUCOPHAGE Take 500 mg by mouth 2 (two) times daily.   OneTouch Delica Lancets 36O Misc 1 each by Does not apply route as needed.   OneTouch Verio w/Device Kit 1 each by Does not apply route as needed.               Durable Medical Equipment  (From admission, onward)           Start     Ordered   10/02/21 1329  For home use only DME Other see  comment  Once       Comments: AFO  Question:  Length of Need  Answer:  Lifetime   10/02/21 1328            Discharge Instructions: Please refer to Patient Instructions section of EMR for full details.  Patient was counseled important signs and symptoms that should prompt return to medical care, changes in medications, dietary instructions, activity restrictions, and follow up appointments.   Follow-Up Appointments:  Follow-up [facility] Follow up on 10/24/2021.   Why: post hospital follow up scheduled with Freeman Caldron PA for 10/24/2020 at 3:00 pm Contact information: [place] [phone] [facility].   Specialty: Rehabilitation Why:  Office will call and arrange outpatient PT appointment time Contact information: [address] [place] [id number] [place] [address] [phone]                Gifford Shave, MD 10/02/2021, 1:43 PM PGY-3, [facility]

## 2021-10-02 NOTE — Progress Notes (Signed)
Orthopedic Tech Progress Note Patient Details:  Eric Ortega [birth year] [id number]  Called in order to [facility] for a AFO CONSULT for the RLE   Patient ID: Eric Ortega, male   DOB: 17-Jan-1961, 60 y.o.   MRN: [id number]  Eric Ortega 10/02/2021, 1:33 PM

## 2021-10-02 NOTE — Progress Notes (Signed)
FPTS Interim Night Progress Note  S:Patient sleeping comfortably.  Rounded with primary night RN.  No concerns voiced.  No orders required.    O: Today's Vitals   10/01/21 1610 10/01/21 1905 10/01/21 1948 10/01/21 2322  BP: 128/78  (!) 145/75 (!) 143/68  Pulse: 83  77 71  Resp: 17  17 15   Temp: 98 F (36.7 C)  98.2 F (36.8 C) 98 F (36.7 C)  TempSrc: Oral     SpO2: 100% 100% 100% 98%  Weight:      Height:      PainSc:  0-No pain        A/P: Continue current management  MD PGY-3, [facility] Family Medicine Service pager [phone]

## 2021-10-02 NOTE — Progress Notes (Signed)
Occupational Therapy Treatment Patient Details Name: Eric Ortega MRN: [id number] DOB: 1961-07-03 Today's Date: 10/02/2021   History of present illness Patient is a 60 y/o male who presents on 09/29/21 with confusion, hypotension, weakness, left facial droop and unilateral weakness with 4 falls in last few days. Found to have acute toxic/metabolic encephalopathy, possible hypoperfusion event, AKI, hyperglycemia and increased ammonia. Brain MRI and Head CT-unremarkable. PMH includes DM, liver cirrhosis, barrett's esophagus and HTN.   OT comments  Pt currently supervision for transfers and at baseline for ADLs. Pt reports getting up ad lib throughout the day, BP taken sitting up in chair 145/79, HR 75, and again after ambulating short distance for bathroom ADL 155/81, HR 72. Pt denies feeling dizzy or lightheaded with mobility, states he is back at baseline. Educated pt on safety for showering at home. Pt presenting with impairments listed below, will continue to follow in the acute setting to maximize safety with ADLs and functional mobility.    Recommendations for follow up therapy are one component of a multi-disciplinary discharge planning process, led by the attending physician.  Recommendations may be updated based on patient status, additional functional criteria and insurance authorization.    Follow Up Recommendations  Other (comment) (follow up with prosthesis to fabricate AFO)    Assistance Recommended at Discharge Intermittent Supervision/Assistance  Equipment Recommendations  None recommended by OT (Pt has all needed DME)    Recommendations for Other Services PT consult    Precautions / Restrictions Precautions Precautions: Fall Precaution Comments: R foot drop Restrictions Weight Bearing Restrictions: No       Mobility Bed Mobility               General bed mobility comments: Pt up in chair upon arrival, reports getting to chair independently    Transfers Overall  transfer level: Needs assistance Equipment used: None Transfers: Sit to/from Stand Sit to Stand: Supervision                 Balance Overall balance assessment: History of Falls;Needs assistance Sitting-balance support: Feet supported;No upper extremity supported Sitting balance-Leahy Scale: Good     Standing balance support: During functional activity Standing balance-Leahy Scale: Good                             ADL either performed or assessed with clinical judgement   ADL Overall ADL's : At baseline                                       General ADL Comments: at baseline with basic ADL tasks    Extremity/Trunk Assessment Upper Extremity Assessment Upper Extremity Assessment: Overall WFL for tasks assessed   Lower Extremity Assessment Lower Extremity Assessment: Defer to PT evaluation        Vision   Vision Assessment?: No apparent visual deficits   Perception     Praxis      Cognition Arousal/Alertness: Awake/alert Behavior During Therapy: WFL for tasks assessed/performed Overall Cognitive Status: Within Functional Limits for tasks assessed                                            Exercises     Shoulder Instructions       General  Comments seated BP145/79 - HR 75, after short ambulation distance for toilet transfer BP 155/81-HR72 seated    Pertinent Vitals/ Pain       Pain Assessment: No/denies pain  Home Living                                          Prior Functioning/Environment              Frequency  Min 2X/week        Progress Toward Goals  OT Goals(current goals can now be found in the care plan section)  Progress towards OT goals: Progressing toward goals  Acute Rehab OT Goals Patient Stated Goal: to go home OT Goal Formulation: With patient Time For Goal Achievement: 10/14/21 Potential to Achieve Goals: Good ADL Goals Additional ADL Goal #1: Pt will  independently demonstrate counter pressure exercises to help decrease symptoms of orthostasis dudring ADL tasks Additional ADL Goal #2: PT will verbalzie 3 strategies to reduce risk of falls  Plan Discharge plan remains appropriate;Frequency remains appropriate    Co-evaluation                 AM-PAC OT "6 Clicks" Daily Activity     Outcome Measure   Help from another person eating meals?: None Help from another person taking care of personal grooming?: None Help from another person toileting, which includes using toliet, bedpan, or urinal?: None Help from another person bathing (including washing, rinsing, drying)?: None Help from another person to put on and taking off regular upper body clothing?: None Help from another person to put on and taking off regular lower body clothing?: None 6 Click Score: 24    End of Session Equipment Utilized During Treatment: Gait belt  OT Visit Diagnosis: Unsteadiness on feet (R26.81);Repeated falls (R29.6);Muscle weakness (generalized) (M62.81)   Activity Tolerance Patient tolerated treatment well   Patient Left in chair;with call bell/phone within reach   Nurse Communication Mobility status        Time: 3151-7616 OT Time Calculation (min): 17 min  Charges: OT General Charges $OT Visit: 1 Visit OT Treatments $Self Care/Home Management : 8-22 mins  Alfonzo Beers, OTD, OTR/L Acute Rehab [phone]) [phone]   Mayer Masker 10/02/2021, 10:25 AM

## 2021-10-08 ENCOUNTER — Ambulatory Visit: Payer: Self-pay

## 2021-10-24 ENCOUNTER — Ambulatory Visit: Payer: Self-pay | Attending: Physician Assistant | Admitting: Physician Assistant

## 2021-10-24 NOTE — Progress Notes (Deleted)
Patient ID: Eric Ortega, male   DOB: 03/08/1961, 61 y.o.   MRN: [id number]  After hospitalization 12/25-12/28/2022  Discharge Diagnoses/Problem List:  Acute encephalopathy, possible hypoperfusion event Recurrent falls Hypotension in the setting of chronic hypertension AKI T2DM HLD Liver cirrhosis Chronic thrombocytopenia Anemia GAD/MDD  Brief Hospital Course:  Eric Ortega is a 61 y.o. male that presented with recurrent falls, weakness and AMS. He has a PMHx of T2DM, HLD, HTN, liver cirrhosis. Below is his hospital course listed by problem, refer to the H&P for additional information.    Acute Encephalopathy  Recurrent Falls  Hypotensive on presentation to 87/57 but awake, alert and oriented. Initial neurological examination notable for scanning speech and word finding difficulties. Initial labs notable for ammonia 65. Non-contrast head CT and cervical spine CT negative for acute findings. MRI brain also negative. 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  PT evaluated the patient and performed orthostatic vital signs which showed a significant drop in the patient's blood pressures when going from sitting to standing.  Feel that this acute encephalopathy may have been related to possible hypoperfusion event.  Neurology was consulted who did recommend an MRI spine which showed L4-L5 and L5-S1 narrowing (which were stable from prior scans).  They did recommend neurosurgery consult outpatient.  Patient continue to work with physical therapy who recommended bracing for foot drop and outpatient PT.     Nonoliguric Pre-renal AKI  Creatinine 1.76 on admission (baseline 0.7-0.8). Improved with fluid administration. Creatinine on discharge 0.97   HTN Presented with hypotension 87/57. Home atenolol 100 mg was reduced to 25 mg on admission to avoid rebound tachycardia with discontinuance. Given AKI, Lisinopril was held. Blood pressures stabilized.   Patient's atenolol was continued at 25 mg at discharge.  Nephrology was consulted who provided recommendations.  Patient's blood pressures normalized and or even slightly elevated prior to discharge.  He was discharged on 25 mg atenolol daily.  Nephrology recommended against restarting chlorthalidone but encouraged evaluation by PCP and possibly restarting ACE inhibitor at low-dose and titrating up.   Hyperglycemia   T2DM Glucose on BMP was 487 in ED. Hgb A1c 11.3. Home medications included metformin 1000mg  BID and glimepiride. Given patient's AKI on arrival, metformin and glimepiride were held. He was started on SSI.     Thrombocytopenia  Platelets 87k on admission. Baseline appears to be around 84k.    GAD/MDD Home medications include Celexa 40 mg daily, amitriptyline 100 mg daily, clonazepam 0.5 mg as needed. All medications were held on admission.  The Celexa was restarted at discharge but the amitriptyline and the clonazepam were discontinued.  Patient reports that he has had issues titrating off of the amitriptyline in the past but is willing to try again.  He will work with his primary care provider on this.   Unintentional Weight Loss  Reported 65 lbs of unintentional weight loss in 7-8 months.    Follow Up Issues:   Ensure up to date on age-appropriate cancer screening. History of unintentional weight loss.  Chronic thrombocytopenia Patient was discharged without restarting ACE inhibitor.  Evaluate blood pressures in the outpatient setting and continue restarting at a low dose Amitriptyline was discontinued during hospitalization.  Patient reports he feels fatigued from the discontinuation but is willing to continue at this time.  Reassess in the outpatient setting. 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  Order placed to  [facility] so ensure patient has gotten this prosthesis

## 2021-11-26 ENCOUNTER — Ambulatory Visit (INDEPENDENT_AMBULATORY_CARE_PROVIDER_SITE_OTHER): Payer: Self-pay | Admitting: Physician Assistant

## 2021-11-26 ENCOUNTER — Other Ambulatory Visit: Payer: Self-pay

## 2021-11-26 ENCOUNTER — Other Ambulatory Visit (HOSPITAL_COMMUNITY)
Admission: EM | Admit: 2021-11-26 | Discharge: 2021-11-27 | Disposition: A | Discharge status: Home / Self Care | Payer: No Payment, Other | Attending: Psychiatry | Admitting: Psychiatry

## 2021-11-26 VITALS — BP 156/71 | HR 57 | Temp 98.2°F | Ht 72.0 in | Wt 203.0 lb

## 2021-11-26 DIAGNOSIS — G4700 Insomnia, unspecified: Secondary | ICD-10-CM

## 2021-11-26 DIAGNOSIS — F332 Major depressive disorder, recurrent severe without psychotic features: Secondary | ICD-10-CM | POA: Diagnosis present

## 2021-11-26 DIAGNOSIS — R45851 Suicidal ideations: Secondary | ICD-10-CM

## 2021-11-26 DIAGNOSIS — Z20822 Contact with and (suspected) exposure to covid-19: Secondary | ICD-10-CM | POA: Insufficient documentation

## 2021-11-26 DIAGNOSIS — F419 Anxiety disorder, unspecified: Secondary | ICD-10-CM | POA: Insufficient documentation

## 2021-11-26 DIAGNOSIS — Z8673 Personal history of transient ischemic attack (TIA), and cerebral infarction without residual deficits: Secondary | ICD-10-CM | POA: Insufficient documentation

## 2021-11-26 DIAGNOSIS — F32A Depression, unspecified: Secondary | ICD-10-CM

## 2021-11-26 DIAGNOSIS — Z79899 Other long term (current) drug therapy: Secondary | ICD-10-CM | POA: Insufficient documentation

## 2021-11-26 DIAGNOSIS — I10 Essential (primary) hypertension: Secondary | ICD-10-CM | POA: Insufficient documentation

## 2021-11-26 DIAGNOSIS — E119 Type 2 diabetes mellitus without complications: Secondary | ICD-10-CM | POA: Insufficient documentation

## 2021-11-26 LAB — RESP PANEL BY RT-PCR (FLU A&B, COVID) ARPGX2
Influenza A by PCR: NEGATIVE
Influenza B by PCR: NEGATIVE
SARS Coronavirus 2 by RT PCR: NEGATIVE

## 2021-11-26 LAB — URINALYSIS, COMPLETE (UACMP) WITH MICROSCOPIC
Bacteria, UA: NONE SEEN
Bilirubin Urine: NEGATIVE
Glucose, UA: NEGATIVE mg/dL
Hgb urine dipstick: NEGATIVE
Ketones, ur: NEGATIVE mg/dL
Leukocytes,Ua: NEGATIVE
Nitrite: NEGATIVE
Protein, ur: NEGATIVE mg/dL
Specific Gravity, Urine: 1.004 — ABNORMAL LOW (ref 1.005–1.030)
pH: 8 (ref 5.0–8.0)

## 2021-11-26 LAB — COMPREHENSIVE METABOLIC PANEL
ALT: 17 U/L (ref 0–44)
AST: 30 U/L (ref 15–41)
Albumin: 3.9 g/dL (ref 3.5–5.0)
Alkaline Phosphatase: 89 U/L (ref 38–126)
Anion gap: 14 (ref 5–15)
BUN: 11 mg/dL (ref 8–23)
CO2: 17 mmol/L — ABNORMAL LOW (ref 22–32)
Calcium: 9.2 mg/dL (ref 8.9–10.3)
Chloride: 107 mmol/L (ref 98–111)
Creatinine, Ser: 0.79 mg/dL (ref 0.61–1.24)
GFR, Estimated: >60 mL/min (ref >60)
Glucose, Bld: 160 mg/dL — ABNORMAL HIGH (ref 70–99)
Potassium: 4 mmol/L (ref 3.5–5.1)
Sodium: 138 mmol/L (ref 135–145)
Total Bilirubin: 1.7 mg/dL — ABNORMAL HIGH (ref 0.3–1.2)
Total Protein: 6.9 g/dL (ref 6.5–8.1)

## 2021-11-26 LAB — POCT URINE DRUG SCREEN - MANUAL ENTRY (I-SCREEN)
POC Amphetamine UR: NOT DETECTED
POC Buprenorphine (BUP): NOT DETECTED
POC Cocaine UR: NOT DETECTED
POC Marijuana UR: NOT DETECTED
POC Methadone UR: NOT DETECTED
POC Methamphetamine UR: NOT DETECTED
POC Morphine: NOT DETECTED
POC Oxazepam (BZO): NOT DETECTED
POC Oxycodone UR: NOT DETECTED
POC Secobarbital (BAR): NOT DETECTED

## 2021-11-26 LAB — TSH: TSH: 1.355 u[IU]/mL (ref 0.350–4.500)

## 2021-11-26 LAB — CBC WITH DIFFERENTIAL/PLATELET
Abs Immature Granulocytes: 0.03 10*3/uL (ref 0.00–0.07)
Basophils Absolute: 0 10*3/uL (ref 0.0–0.1)
Basophils Relative: 1 %
Eosinophils Absolute: 0.2 10*3/uL (ref 0.0–0.5)
Eosinophils Relative: 3 %
HCT: 41.2 % (ref 39.0–52.0)
Hemoglobin: 14.6 g/dL (ref 13.0–17.0)
Immature Granulocytes: 1 %
Lymphocytes Relative: 29 %
Lymphs Abs: 2 10*3/uL (ref 0.7–4.0)
MCH: 29.6 pg (ref 26.0–34.0)
MCHC: 35.4 g/dL (ref 30.0–36.0)
MCV: 83.4 fL (ref 80.0–100.0)
Monocytes Absolute: 0.7 10*3/uL (ref 0.1–1.0)
Monocytes Relative: 10 %
Neutro Abs: 3.8 10*3/uL (ref 1.7–7.7)
Neutrophils Relative %: 56 %
Platelets: 118 10*3/uL — ABNORMAL LOW (ref 150–400)
RBC: 4.94 MIL/uL (ref 4.22–5.81)
RDW: 14.3 % (ref 11.5–15.5)
WBC: 6.6 10*3/uL (ref 4.0–10.5)
nRBC: 0 % (ref 0.0–0.2)

## 2021-11-26 LAB — HEMOGLOBIN A1C
Hgb A1c MFr Bld: 7.7 % — ABNORMAL HIGH (ref 4.8–5.6)
Mean Plasma Glucose: 174 mg/dL

## 2021-11-26 LAB — LIPID PANEL
Cholesterol: 142 mg/dL (ref 0–200)
HDL: 77 mg/dL (ref >40)
LDL Cholesterol: 51 mg/dL (ref 0–99)
Total CHOL/HDL Ratio: 1.8 RATIO
Triglycerides: 72 mg/dL (ref <150)
VLDL: 14 mg/dL (ref 0–40)

## 2021-11-26 LAB — ETHANOL: Alcohol, Ethyl (B): <10 mg/dL (ref <10)

## 2021-11-26 LAB — GLUCOSE, CAPILLARY: Glucose-Capillary: 143 mg/dL — ABNORMAL HIGH (ref 70–99)

## 2021-11-26 LAB — MAGNESIUM: Magnesium: 2 mg/dL (ref 1.7–2.4)

## 2021-11-26 MED ORDER — ATENOLOL 25 MG PO TABS
25.0000 mg | ORAL_TABLET | Freq: Every day | ORAL | Status: DC
  Administered 2021-11-26: 25 mg via ORAL
  Filled 2021-11-26: qty 1

## 2021-11-26 MED ORDER — MAGNESIUM HYDROXIDE 400 MG/5ML PO SUSP: 30.0000 mL | Freq: Every day | ORAL | Status: DC | PRN

## 2021-11-26 MED ORDER — ALUM & MAG HYDROXIDE-SIMETH 200-200-20 MG/5ML PO SUSP: 30.0000 mL | ORAL | Status: DC | PRN

## 2021-11-26 MED ORDER — ACETAMINOPHEN 325 MG PO TABS: 650.0000 mg | ORAL_TABLET | Freq: Four times a day (QID) | ORAL | Status: DC | PRN

## 2021-11-26 MED ORDER — QUETIAPINE FUMARATE 100 MG PO TABS
100.0000 mg | ORAL_TABLET | Freq: Every day | ORAL | Status: DC
  Administered 2021-11-26: 100 mg via ORAL
  Filled 2021-11-26: qty 1

## 2021-11-26 MED ORDER — INSULIN ASPART PROT & ASPART (70-30 MIX) 100 UNIT/ML ~~LOC~~ SUSP
10.0000 [IU] | Freq: Two times a day (BID) | SUBCUTANEOUS | Status: DC
  Administered 2021-11-26 – 2021-11-27 (×2): 10 [IU] via SUBCUTANEOUS

## 2021-11-26 MED ORDER — TRAZODONE HCL 50 MG PO TABS: 50.0000 mg | ORAL_TABLET | Freq: Every evening | ORAL | Status: DC | PRN

## 2021-11-26 MED ORDER — ATORVASTATIN CALCIUM 40 MG PO TABS
40.0000 mg | ORAL_TABLET | Freq: Every day | ORAL | Status: DC
  Administered 2021-11-26: 40 mg via ORAL
  Filled 2021-11-26: qty 1

## 2021-11-26 MED ORDER — TRAZODONE HCL 50 MG PO TABS
50.0000 mg | ORAL_TABLET | Freq: Once | ORAL | Status: AC
  Administered 2021-11-27: 50 mg via ORAL
  Filled 2021-11-26: qty 1

## 2021-11-26 MED ORDER — HYDROXYZINE HCL 25 MG PO TABS
100.0000 mg | ORAL_TABLET | Freq: Every day | ORAL | Status: DC
  Administered 2021-11-26: 100 mg via ORAL
  Filled 2021-11-26: qty 4

## 2021-11-26 MED ORDER — HYDROXYZINE HCL 25 MG PO TABS
25.0000 mg | ORAL_TABLET | Freq: Three times a day (TID) | ORAL | Status: DC | PRN
  Administered 2021-11-26: 25 mg via ORAL
  Filled 2021-11-26: qty 1

## 2021-11-26 MED ORDER — METFORMIN HCL 500 MG PO TABS
500.0000 mg | ORAL_TABLET | Freq: Two times a day (BID) | ORAL | Status: DC
  Administered 2021-11-26 – 2021-11-27 (×2): 500 mg via ORAL
  Filled 2021-11-26 (×2): qty 1

## 2021-11-26 NOTE — Progress Notes (Signed)
Received Eric Ortega in the assessment room earlier, he was cooperative with the admission process. He ate and was relocated to the OBS area. He was medicated with Vistaril per his request. He made several calls before he settled down in his chair bed.

## 2021-11-26 NOTE — ED Notes (Signed)
Pt reported to nurse that he is unable to sleep. Pt stated he feels sleepy but he can not get comfortable in the bed. Nurse gave Pt an extra pillow upon request to help him get comfortable. Will continue to monitor for safety. Provider notified.

## 2021-11-26 NOTE — ED Notes (Signed)
Pt just came to the floor

## 2021-11-26 NOTE — ED Provider Notes (Addendum)
Behavioral Health Admission [facility] & OBS)  Date: 11/26/21 Patient Name: Eric Ortega MRN: [id number] Chief Complaint: No chief complaint on file.     Diagnoses:  Final diagnoses:  Severe episode of recurrent major depressive disorder, without psychotic features (HCC)  Insomnia, unspecified type    HPI: patient presented to [facility] as a walk in alone with complaints of increased anxiety, depression and insomnia.  Eric Ortega, 61 y.o., male patient seen face to face by this provider, consulted with Dr. Lucianne Muss; and chart reviewed on 11/26/21.  Patient has a past psychiatric history of anxiety and depression.  Reports his anxiety began 10 years ago when he got stuck in a "turn style" and was there for 3 hours.  He has a history of 2 strokes, diabetes and hypertension.  He has not used alcohol in over 8 months.  Denies any current alcohol or substance use.  On evaluation Eric Ortega reports today he had a new patient appointment with a PCP Alyssa Allwardt PA-C.  During the visit he expressed suicidal thoughts with no plan.  His GAD score was 21 and his PHQ-9 was 24.  His PCP referred him to [facility] for an assessment.  Patient reports he lives with his 59 year old mother.  He has been divorced for roughly 6 years.  He has 4 kids and 8 grandchildren.  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  Patient states his symptoms begin to worsen in December 2022.  He was "let go" from a job.  He was admitted to the hospital after a fall where he hit his head, AMS and weakness.  He has foot drop and wears a brace on his right foot. However he states that he does not wear the brace at home and he is not unstable/unsteady when walking.  Denies having any falls since December. He denies any weakness. During the hospitalization patient had AKI with a creatinine of 1.76 on discharge it was 0.97.  During his hospitalization his amitriptyline, gabapentin, glimepiride, lisinopril, and Klonopin were discontinued.   Patient was discharged on atenolol, atorvastatin, metformin, atorvastatin, and insulin aspart 70-30.  Reports he had severe withdrawal symptoms after being discharged from the hospital from abruptly stopping his medications.    During the evaluation he is in sitting position in no acute distress.  He is well-groomed and makes good eye contact.  His speech is clear, coherent, normal rate and tone.  He is pleasant.  He is alert/oriented x4 and cooperative.  He endorses increased depression and anxiety with a congruent affect.  He endorses feelings of hopelessness and helplessness.  He has a decreased appetite and has lost over 70 pounds in the past 7 months without trying.  Reports he only sleeps 1-2 hours per night for the past 4 weeks and states, "I cannot shut my brain off and I cannot take it anymore".  He is prescribed 50 mg trazodone but admits to taking 400 mg at night to try sleep and it was ineffective.  He is also taken up to 8 Unisom at nighttime that were also ineffective for sleep.  States his frustration over lack of sleep is triggering suicidal thoughts.  States, "I am glad I do not have it done".  Patient denies any intent or plan for suicide.  However he cannot contract for safety when asked if he could be safe if he went home he states, "I do not know".Objectively he does not appear to be responding to internal/external stimuli.  He denies paranoid or delusional thought content.  He denies AVH and HI.  Discussed facility base crisis unit admission with patient.  Explained the milieu and expectations including COVID testing, lab work, and EKG.  Patient agrees    PHQ 2-9:   Flowsheet Row ED to Hosp-Admission (Discharged) from 09/29/2021 in [facility]  C-SSRS RISK CATEGORY No Risk        Total Time spent with patient: 45 minutes  Musculoskeletal  Strength & Muscle Tone: within normal limits Gait & Station: normal Patient leans: N/A  Psychiatric  Specialty Exam  Presentation General Appearance: Appropriate for Environment; Fairly Groomed  Eye Contact:Good  Speech:Clear and Coherent; Normal Rate  Speech Volume:Normal  Handedness:Right   Mood and Affect  Mood:Depressed; Anxious  Affect:Congruent   Thought Process  Thought Processes:Coherent  Descriptions of Associations:Intact  Orientation:Full (Time, Place and Person)  Thought Content:Logical    Hallucinations:Hallucinations: None  Ideas of Reference:None  Suicidal Thoughts:Suicidal Thoughts: Yes, Passive SI Passive Intent and/or Plan: Without Intent; Without Plan; Without Means to Carry Out  Homicidal Thoughts:Homicidal Thoughts: No   Sensorium  Memory:Immediate Good; Recent Good; Remote Good  Judgment:Fair  Insight:Good   Executive Functions  Concentration:Good  Attention Span:Good  Recall:Good  Fund of Knowledge:Good  Language:Good   Psychomotor Activity  Psychomotor Activity:Psychomotor Activity: Normal   Assets  Assets:Communication Skills; Desire for Improvement; Financial Resources/Insurance; Housing; Physical Health; Resilience; Agricultural engineer; Vocational/Educational   Sleep  Sleep:Sleep: Poor Number of Hours of Sleep: 1   Nutritional Assessment (For OBS and FBC admissions only) Has the patient had a weight loss or gain of 10 pounds or more in the last 3 months?: Yes Has the patient had a decrease in food intake/or appetite?: Yes Does the patient have dental problems?: No Does the patient have eating habits or behaviors that may be indicators of an eating disorder including binging or inducing vomiting?: No Has the patient recently lost weight without trying?: 4 Has the patient been eating poorly because of a decreased appetite?: 1 Malnutrition Screening Tool Score: 5 Nutritional Assessment Referrals: Refer for Nutritional Consult to Nutrition and Diabetes Service    Physical Exam Vitals and nursing  note reviewed.  Constitutional:      General: He is not in acute distress.    Appearance: Normal appearance. He is well-developed.  HENT:     Head: Normocephalic and atraumatic.  Eyes:     General:        Right eye: No discharge.        Left eye: No discharge.     Conjunctiva/sclera: Conjunctivae normal.  Cardiovascular:     Rate and Rhythm: Normal rate.  Pulmonary:     Effort: Pulmonary effort is normal. No respiratory distress.  Abdominal:     Tenderness: There is no abdominal tenderness.  Musculoskeletal:        General: Normal range of motion.     Cervical back: Normal range of motion.  Skin:    Coloration: Skin is not jaundiced or pale.  Neurological:     Mental Status: He is alert and oriented to person, place, and time.  Psychiatric:        Attention and Perception: Perception normal.        Mood and Affect: Mood is anxious and depressed. Affect is tearful.        Speech: Speech normal.        Behavior: Behavior is cooperative.  Thought Content: Thought content includes suicidal ideation. Thought content does not include suicidal plan.        Cognition and Memory: Cognition normal.        Judgment: Judgment normal.   Review of Systems  Constitutional: Negative.   HENT: Negative.    Eyes: Negative.   Respiratory: Negative.    Cardiovascular: Negative.   Musculoskeletal: Negative.   Skin: Negative.   Neurological: Negative.   Psychiatric/Behavioral:  Positive for depression and suicidal ideas. The patient is nervous/anxious and has insomnia.    Blood pressure (!) 163/95, pulse (!) 57, temperature 98.3 F (36.8 C), temperature source Oral, resp. rate 19, SpO2 100 %. There is no height or weight on file to calculate BMI.  Past Psychiatric History: mdd gad   Is the patient at risk to self? Yes  Has the patient been a risk to self in the past 6 months? No .    Has the patient been a risk to self within the distant past? No   Is the patient a risk to others?  No   Has the patient been a risk to others in the past 6 months? No   Has the patient been a risk to others within the distant past? No   Past Medical History:  Past Medical History:  Diagnosis Date   Diabetes mellitus without complication (HCC)    Hyperlipidemia    Hypertension     Past Surgical History:  Procedure Laterality Date   APPENDECTOMY      Family History:  Family History  Problem Relation Age of Onset   Diabetes Mother    Heart failure Mother    Heart failure Father     Social History:  Social History   Socioeconomic History   Marital status: Married    Spouse name: Not on file   Number of children: Not on file   Years of education: Not on file   Highest education level: Not on file  Occupational History   Not on file  Tobacco Use   Smoking status: Former   Smokeless tobacco: Never  Vaping Use   Vaping Use: Never used  Substance and Sexual Activity   Alcohol use: No   Drug use: No   Sexual activity: Not Currently  Other Topics Concern   Not on file  Social History Narrative   Not on file   Social Determinants of Health   Financial Resource Strain: Not on file  Food Insecurity: Not on file  Transportation Needs: Not on file  Physical Activity: Not on file  Stress: Not on file  Social Connections: Not on file  Intimate Partner Violence: Not on file    SDOH:  SDOH Screenings   Alcohol Screen: Not on file  Depression (PHQ2-9): Not on file  Financial Resource Strain: Not on file  Food Insecurity: Not on file  Housing: Not on file  Physical Activity: Not on file  Social Connections: Not on file  Stress: Not on file  Tobacco Use: Medium Risk   Smoking Tobacco Use: Former   Smokeless Tobacco Use: Never   Passive Exposure: Not on file  Transportation Needs: Not on file    Last Labs:  Admission on 09/29/2021, Discharged on 10/02/2021  Component Date Value Ref Range Status   Sodium 09/29/2021 131 (L)  135 - 145 mmol/L Final    Potassium 09/29/2021 4.2  3.5 - 5.1 mmol/L Final   Chloride 09/29/2021 100  98 - 111 mmol/L Final   CO2  09/29/2021 21 (L)  22 - 32 mmol/L Final   Glucose, Bld 09/29/2021 487 (H)  70 - 99 mg/dL Final   Glucose reference range applies only to samples taken after fasting for at least 8 hours.   BUN 09/29/2021 27 (H)  8 - 23 mg/dL Final   Creatinine, Ser 09/29/2021 1.76 (H)  0.61 - 1.24 mg/dL Final   Calcium 16/07/9603 8.8 (L)  8.9 - 10.3 mg/dL Final   Total Protein 54/06/8118 5.7 (L)  6.5 - 8.1 g/dL Final   Albumin 14/78/2956 3.2 (L)  3.5 - 5.0 g/dL Final   AST 21/30/8657 24  15 - 41 U/L Final   ALT 09/29/2021 13  0 - 44 U/L Final   Alkaline Phosphatase 09/29/2021 151 (H)  38 - 126 U/L Final   Total Bilirubin 09/29/2021 1.0  0.3 - 1.2 mg/dL Final   GFR, Estimated 09/29/2021 43 (L)  >60 mL/min Final   Comment: (NOTE) Calculated using the CKD-EPI Creatinine Equation (2021)    Anion gap 09/29/2021 10  5 - 15 Final   Performed at [facility], [address], [place], [place] [address]   WBC 09/29/2021 5.2  4.0 - 10.5 K/uL Final   RBC 09/29/2021 4.25  4.22 - 5.81 MIL/uL Final   Hemoglobin 09/29/2021 13.1  13.0 - 17.0 g/dL Final   HCT 29/52/8413 35.9 (L)  39.0 - 52.0 % Final   MCV 09/29/2021 84.5  80.0 - 100.0 fL Final   MCH 09/29/2021 30.8  26.0 - 34.0 pg Final   MCHC 09/29/2021 36.5 (H)  30.0 - 36.0 g/dL Final   RDW 24/40/1027 12.9  11.5 - 15.5 % Final   Platelets 09/29/2021 87 (L)  150 - 400 K/uL Final   Comment: Immature Platelet Fraction may be clinically indicated, consider ordering this additional test [id number] REPEATED TO VERIFY PLATELET COUNT CONFIRMED BY SMEAR    nRBC 09/29/2021 0.0  0.0 - 0.2 % Final   Neutrophils Relative % 09/29/2021 56  % Final   Neutro Abs 09/29/2021 2.9  1.7 - 7.7 K/uL Final   Lymphocytes Relative 09/29/2021 25  % Final   Lymphs Abs 09/29/2021 1.3  0.7 - 4.0 K/uL Final   Monocytes Relative 09/29/2021 10  % Final   Monocytes Absolute 09/29/2021  0.5  0.1 - 1.0 K/uL Final   Eosinophils Relative 09/29/2021 7  % Final   Eosinophils Absolute 09/29/2021 0.4  0.0 - 0.5 K/uL Final   Basophils Relative 09/29/2021 1  % Final   Basophils Absolute 09/29/2021 0.1  0.0 - 0.1 K/uL Final   Immature Granulocytes 09/29/2021 1  % Final   Abs Immature Granulocytes 09/29/2021 0.03  0.00 - 0.07 K/uL Final   Performed at [facility], [address], [place], [place] [address]   Troponin I (High Sensitivity) 09/29/2021 5  <18 ng/L Final   Comment: (NOTE) Elevated high sensitivity troponin I (hsTnI) values and significant  changes across serial measurements may suggest ACS but many other  chronic and acute conditions are known to elevate hsTnI results.  Refer to the "Links" section for chest pain algorithms and additional  guidance. Performed at [facility], [address], [place], [place] [address]    Ammonia 09/29/2021 65 (H)  9 - 35 umol/L Final   Performed at [facility], [address], [place], [place] [address]   Color, Urine 09/30/2021 YELLOW  YELLOW Final   APPearance 09/30/2021 CLEAR  CLEAR Final  Specific Gravity, Urine 09/30/2021 1.015  1.005 - 1.030 Final   pH 09/30/2021 5.5  5.0 - 8.0 Final   Glucose, UA 09/30/2021 >=500 (A)  NEGATIVE mg/dL Final   Hgb urine dipstick 09/30/2021 NEGATIVE  NEGATIVE Final   Bilirubin Urine 09/30/2021 NEGATIVE  NEGATIVE Final   Ketones, ur 09/30/2021 NEGATIVE  NEGATIVE mg/dL Final   Protein, ur 40/98/1191 NEGATIVE  NEGATIVE mg/dL Final   Nitrite 47/82/9562 NEGATIVE  NEGATIVE Final   Leukocytes,Ua 09/30/2021 NEGATIVE  NEGATIVE Final   Performed at [facility], [address] [place], [place] [address]   SARS Coronavirus 2 by RT PCR 09/29/2021 NEGATIVE  NEGATIVE Final   Comment: (NOTE) SARS-CoV-2 target nucleic acids are NOT DETECTED.  The SARS-CoV-2 RNA is generally detectable in upper respiratory specimens during the acute phase of infection. The lowest concentration of  SARS-CoV-2 viral copies this assay can detect is 138 copies/mL. A negative result does not preclude SARS-Cov-2 infection and should not be used as the sole basis for treatment or other patient management decisions. A negative result may occur with  improper specimen collection/handling, submission of specimen other than nasopharyngeal swab, presence of viral mutation(s) within the areas targeted by this assay, and inadequate number of viral copies(<138 copies/mL). A negative result must be combined with clinical observations, patient history, and epidemiological information. The expected result is Negative.  Fact Sheet for Patients:  BloggerCourse.com  Fact Sheet for Healthcare Providers:  SeriousBroker.it  This test is no                          t yet approved or cleared by the [place] FDA and  has been authorized for detection and/or diagnosis of SARS-CoV-2 by FDA under an Emergency Use Authorization (EUA). This EUA will remain  in effect (meaning this test can be used) for the duration of the COVID-19 declaration under Section 564(b)(1) of the Act, 21 U.S.C.section 360bbb-3(b)(1), unless the authorization is terminated  or revoked sooner.       Influenza A by PCR 09/29/2021 NEGATIVE  NEGATIVE Final   Influenza B by PCR 09/29/2021 NEGATIVE  NEGATIVE Final   Comment: (NOTE) The Xpert Xpress SARS-CoV-2/FLU/RSV plus assay is intended as an aid in the diagnosis of influenza from Nasopharyngeal swab specimens and should not be used as a sole basis for treatment. Nasal washings and aspirates are unacceptable for Xpert Xpress SARS-CoV-2/FLU/RSV testing.  Fact Sheet for Patients: BloggerCourse.com  Fact Sheet for Healthcare Providers: SeriousBroker.it  This test is not yet approved or cleared by the [place] FDA and has been authorized for detection and/or diagnosis of  SARS-CoV-2 by FDA under an Emergency Use Authorization (EUA). This EUA will remain in effect (meaning this test can be used) for the duration of the COVID-19 declaration under Section 564(b)(1) of the Act, 21 U.S.C. section 360bbb-3(b)(1), unless the authorization is terminated or revoked.  Performed at [facility], [address], [place], [place] [address]    Prothrombin Time 09/29/2021 14.8  11.4 - 15.2 seconds Final   INR 09/29/2021 1.2  0.8 - 1.2 Final   Comment: (NOTE) INR goal varies based on device and disease states. Performed at [facility], [address] [place], [place] [address]    Opiates 09/30/2021 NONE DETECTED  NONE DETECTED Final   Cocaine 09/30/2021 NONE DETECTED  NONE DETECTED Final   Benzodiazepines 09/30/2021 NONE DETECTED  NONE DETECTED Final   Amphetamines 09/30/2021 NONE  DETECTED  NONE DETECTED Final   Tetrahydrocannabinol 09/30/2021 NONE DETECTED  NONE DETECTED Final   Barbiturates 09/30/2021 NONE DETECTED  NONE DETECTED Final   Comment: (NOTE) DRUG SCREEN FOR MEDICAL PURPOSES ONLY.  IF CONFIRMATION IS NEEDED FOR ANY PURPOSE, NOTIFY LAB WITHIN 5 DAYS.  LOWEST DETECTABLE LIMITS FOR URINE DRUG SCREEN Drug Class                     Cutoff (ng/mL) Amphetamine and metabolites    1000 Barbiturate and metabolites    200 Benzodiazepine                 200 Tricyclics and metabolites     300 Opiates and metabolites        300 Cocaine and metabolites        300 THC                            50 Performed at [facility], [address], [place], [place] [address]    Alcohol, Ethyl (B) 09/29/2021 <10  <10 mg/dL Final   Comment: (NOTE) Lowest detectable limit for serum alcohol is 10 mg/dL.  For medical purposes only. Performed at [facility], [address], [place], [place] [address]    Troponin I (High Sensitivity) 09/29/2021 5  <18 ng/L Final   Comment: (NOTE) Elevated high sensitivity troponin I (hsTnI) values and  significant  changes across serial measurements may suggest ACS but many other  chronic and acute conditions are known to elevate hsTnI results.  Refer to the "Links" section for chest pain algorithms and additional  guidance. Performed at [facility], [address] [place], [place] [address]    HIV Screen 4th Generation wRfx 09/30/2021 Non Reactive  Non Reactive Final   Performed at [facility], [address], [place], [place] [address]   Weight 09/30/2021 3,333.36  oz Final   Height 09/30/2021 72  in Final   BP 09/30/2021 111/66  mmHg Final   Single Plane A2C EF 09/30/2021 57.2  % Final   Single Plane A4C EF 09/30/2021 57.9  % Final   Calc EF 09/30/2021 55.8  % Final   AR max vel 09/30/2021 2.70  cm2 Final   AV Area VTI 09/30/2021 2.71  cm2 Final   AV Mean grad 09/30/2021 4.0  mmHg Final   AV Peak grad 09/30/2021 8.1  mmHg Final   Ao pk vel 09/30/2021 1.42  m/s Final   Area-P 1/2 09/30/2021 3.48  cm2 Final   AV Area mean vel 09/30/2021 2.66  cm2 Final   TSH 09/30/2021 0.878  0.350 - 4.500 uIU/mL Final   Comment: Performed by a 3rd Generation assay with a functional sensitivity of <=0.01 uIU/mL. Performed at [facility], [address] [place], [place] [address]    Sodium 09/30/2021 133 (L)  135 - 145 mmol/L Final   Potassium 09/30/2021 3.4 (L)  3.5 - 5.1 mmol/L Final   DELTA CHECK NOTED   Chloride 09/30/2021 102  98 - 111 mmol/L Final   CO2 09/30/2021 22  22 - 32 mmol/L Final   Glucose, Bld 09/30/2021 345 (H)  70 - 99 mg/dL Final   Glucose reference range applies only to samples taken after fasting for at least 8 hours.   BUN 09/30/2021 28 (H)  8 - 23 mg/dL Final   Creatinine, Ser 09/30/2021 1.74 (H)  0.61 - 1.24 mg/dL Final   Calcium 04/54/098112/26/2022 8.8 (L)  8.9 - 10.3 mg/dL Final   Total Protein 19/14/782912/26/2022 5.8 (L)  6.5 - 8.1 g/dL Final   Albumin 56/21/308612/26/2022 3.2 (L)  3.5 - 5.0 g/dL Final   AST 57/84/696212/26/2022 19  15 - 41 U/L Final   ALT 09/30/2021 15  0 - 44  U/L Final   Alkaline Phosphatase 09/30/2021 101  38 - 126 U/L Final   Total Bilirubin 09/30/2021 1.1  0.3 - 1.2 mg/dL Final   GFR, Estimated 09/30/2021 44 (L)  >60 mL/min Final   Comment: (NOTE) Calculated using the CKD-EPI Creatinine Equation (2021)    Anion gap 09/30/2021 9  5 - 15 Final   Performed at [facility], [address] [place], [place] [address]   WBC 09/30/2021 5.1  4.0 - 10.5 K/uL Final   RBC 09/30/2021 4.22  4.22 - 5.81 MIL/uL Final   Hemoglobin 09/30/2021 12.7 (L)  13.0 - 17.0 g/dL Final   HCT 13/24/401012/26/2022 34.9 (L)  39.0 - 52.0 % Final   MCV 09/30/2021 82.7  80.0 - 100.0 fL Final   MCH 09/30/2021 30.1  26.0 - 34.0 pg Final   MCHC 09/30/2021 36.4 (H)  30.0 - 36.0 g/dL Final   RDW 27/25/366412/26/2022 13.1  11.5 - 15.5 % Final   Platelets 09/30/2021 78 (L)  150 - 400 K/uL Final   Comment: Immature Platelet Fraction may be clinically indicated, consider ordering this additional test [id number] CONSISTENT WITH PREVIOUS RESULT REPEATED TO VERIFY    nRBC 09/30/2021 0.0  0.0 - 0.2 % Final   Performed at [facility], [address] [place], [place] [address]   Total CK 09/30/2021 50  49 - 397 U/L Final   Performed at [facility], [address] [place], [place] [address]   Vitamin B-12 09/30/2021 518  180 - 914 pg/mL Final   Comment: (NOTE) This assay is not validated for testing neonatal or myeloproliferative syndrome specimens for Vitamin B12 levels. Performed at [facility], [address] [place], [place] [address]    Folate 09/30/2021 21.4  >5.9 ng/mL Final   Performed at [facility], [address] [place], [place] [address]   Vitamin B1 (Thiamine) 09/30/2021 166.7  66.5 - 200.0 nmol/L Final   Comment: (NOTE) This test was developed and its performance characteristics determined by [facility]. It has not been cleared or approved by the Food and Drug Administration. Performed At: [facility] [address] [place], [place]  [id number] Jolene SchimkeNagendra Sanjai MD UX:[id number]:[phone]    Hgb A1c MFr Bld 09/30/2021 11.3 (H)  4.8 - 5.6 % Final   Comment: (NOTE) Pre diabetes:          5.7%-6.4%  Diabetes:              >6.4%  Glycemic control for   <7.0% adults with diabetes    Mean Plasma Glucose 09/30/2021 277.61  mg/dL Final   Performed at [facility], [address] [place], [place] [address]   Cholesterol 09/30/2021 163  0 - 200 mg/dL Final   Triglycerides 06/23/762812/26/2022 148  <150 mg/dL Final   HDL 31/51/761612/26/2022 48  >40 mg/dL Final   Total CHOL/HDL Ratio 09/30/2021 3.4  RATIO Final   VLDL 09/30/2021 30  0 - 40 mg/dL Final   LDL Cholesterol 09/30/2021 85  0 - 99 mg/dL Final   Comment:  Total Cholesterol/HDL:CHD Risk Coronary Heart Disease Risk Table                     Men   Women  1/2 Average Risk   3.4   3.3  Average Risk       5.0   4.4  2 X Average Risk   9.6   7.1  3 X Average Risk  23.4   11.0        Use the calculated Patient Ratio above and the CHD Risk Table to determine the patient's CHD Risk.        ATP III CLASSIFICATION (LDL):  <100     mg/dL   Optimal  409-811  mg/dL   Near or Above                    Optimal  130-159  mg/dL   Borderline  914-782  mg/dL   High  >956     mg/dL   Very High Performed at [facility], [address], [place], [place] [address]    Glucose-Capillary 09/29/2021 223 (H)  70 - 99 mg/dL Final   Glucose reference range applies only to samples taken after fasting for at least 8 hours.   Total CK 09/29/2021 56  49 - 397 U/L Final   Performed at [facility], [address] [place], [place] [address]   RPR Ser Ql 09/30/2021 NON REACTIVE  NON REACTIVE Final   Performed at [facility], [address], [place], [place] [address]   Glucose-Capillary 09/30/2021 287 (H)  70 - 99 mg/dL Final   Glucose reference range applies only to samples taken after fasting for at least 8 hours.   RBC / HPF 09/30/2021 0-5  0 - 5 RBC/hpf Final   WBC, UA 09/30/2021  0-5  0 - 5 WBC/hpf Final   Bacteria, UA 09/30/2021 RARE (A)  NONE SEEN Final   Squamous Epithelial / LPF 09/30/2021 0-5  0 - 5 Final   Mucus 09/30/2021 PRESENT   Final   Hyaline Casts, UA 09/30/2021 PRESENT   Final   Performed at [facility], [address], [place], [place] [address]   Glucose-Capillary 09/30/2021 274 (H)  70 - 99 mg/dL Final   Glucose reference range applies only to samples taken after fasting for at least 8 hours.   Glucose-Capillary 09/30/2021 232 (H)  70 - 99 mg/dL Final   Glucose reference range applies only to samples taken after fasting for at least 8 hours.   WBC 10/01/2021 4.3  4.0 - 10.5 K/uL Final   RBC 10/01/2021 4.04 (L)  4.22 - 5.81 MIL/uL Final   Hemoglobin 10/01/2021 12.2 (L)  13.0 - 17.0 g/dL Final   HCT 32/44/0102 33.5 (L)  39.0 - 52.0 % Final   MCV 10/01/2021 82.9  80.0 - 100.0 fL Final   MCH 10/01/2021 30.2  26.0 - 34.0 pg Final   MCHC 10/01/2021 36.4 (H)  30.0 - 36.0 g/dL Final   RDW 72/53/6644 13.1  11.5 - 15.5 % Final   Platelets 10/01/2021 78 (L)  150 - 400 K/uL Final   Comment: Immature Platelet Fraction may be clinically indicated, consider ordering this additional test [id number] CONSISTENT WITH PREVIOUS RESULT REPEATED TO VERIFY    nRBC 10/01/2021 0.0  0.0 - 0.2 % Final   Performed at [facility], [address], [place], [place] [address]   Sodium 10/01/2021  137  135 - 145 mmol/L Final   Potassium 10/01/2021 3.5  3.5 - 5.1 mmol/L Final   Chloride 10/01/2021 104  98 - 111 mmol/L Final   CO2 10/01/2021 22  22 - 32 mmol/L Final   Glucose, Bld 10/01/2021 255 (H)  70 - 99 mg/dL Final   Glucose reference range applies only to samples taken after fasting for at least 8 hours.   BUN 10/01/2021 23  8 - 23 mg/dL Final   Creatinine, Ser 10/01/2021 1.31 (H)  0.61 - 1.24 mg/dL Final   Calcium 16/10/960412/27/2022 8.7 (L)  8.9 - 10.3 mg/dL Final   GFR, Estimated 10/01/2021 >60  >60 mL/min Final   Comment: (NOTE) Calculated using the CKD-EPI  Creatinine Equation (2021)    Anion gap 10/01/2021 11  5 - 15 Final   Performed at [facility], [address] [place], [place] [address]   Glucose-Capillary 09/30/2021 221 (H)  70 - 99 mg/dL Final   Glucose reference range applies only to samples taken after fasting for at least 8 hours.   Glucose-Capillary 10/01/2021 291 (H)  70 - 99 mg/dL Final   Glucose reference range applies only to samples taken after fasting for at least 8 hours.   Glucose-Capillary 10/01/2021 206 (H)  70 - 99 mg/dL Final   Glucose reference range applies only to samples taken after fasting for at least 8 hours.   Glucose-Capillary 10/01/2021 179 (H)  70 - 99 mg/dL Final   Glucose reference range applies only to samples taken after fasting for at least 8 hours.   WBC 10/02/2021 4.2  4.0 - 10.5 K/uL Final   RBC 10/02/2021 3.99 (L)  4.22 - 5.81 MIL/uL Final   Hemoglobin 10/02/2021 11.9 (L)  13.0 - 17.0 g/dL Final   HCT 11/91/478212/28/2022 33.2 (L)  39.0 - 52.0 % Final   MCV 10/02/2021 83.2  80.0 - 100.0 fL Final   MCH 10/02/2021 29.8  26.0 - 34.0 pg Final   MCHC 10/02/2021 35.8  30.0 - 36.0 g/dL Final   RDW 95/62/130812/28/2022 12.8  11.5 - 15.5 % Final   Platelets 10/02/2021 70 (L)  150 - 400 K/uL Final   Comment: Immature Platelet Fraction may be clinically indicated, consider ordering this additional test [id number] CONSISTENT WITH PREVIOUS RESULT REPEATED TO VERIFY    nRBC 10/02/2021 0.0  0.0 - 0.2 % Final   Performed at [facility], [address] [place], [place] [address]   Sodium 10/02/2021 134 (L)  135 - 145 mmol/L Final   Potassium 10/02/2021 3.5  3.5 - 5.1 mmol/L Final   Chloride 10/02/2021 102  98 - 111 mmol/L Final   CO2 10/02/2021 25  22 - 32 mmol/L Final   Glucose, Bld 10/02/2021 202 (H)  70 - 99 mg/dL Final   Glucose reference range applies only to samples taken after fasting for at least 8 hours.   BUN 10/02/2021 18  8 - 23 mg/dL Final   Creatinine, Ser 10/02/2021 0.97  0.61 - 1.24 mg/dL Final    Calcium 41/32/440112/28/2022 8.9  8.9 - 10.3 mg/dL Final   GFR, Estimated 10/02/2021 >60  >60 mL/min Final   Comment: (NOTE) Calculated using the CKD-EPI Creatinine Equation (2021)    Anion gap 10/02/2021 7  5 - 15 Final   Performed at [facility], [address] [place], [place] [address]   Glucose-Capillary 10/01/2021 194 (H)  70 - 99 mg/dL Final   Glucose reference range applies  only to samples taken after fasting for at least 8 hours.   Glucose-Capillary 10/02/2021 180 (H)  70 - 99 mg/dL Final   Glucose reference range applies only to samples taken after fasting for at least 8 hours.   Glucose-Capillary 10/02/2021 252 (H)  70 - 99 mg/dL Final   Glucose reference range applies only to samples taken after fasting for at least 8 hours.    Allergies: Patient has no known allergies.  PTA Medications: (Not in a hospital admission)   Medical Decision Making  Patient presents to the [facility] with increased depression, anxiety, anxiety, and passive suicidal ideation.  He denies any intent, plan, or access to means.  However he cannot contract for safety.  He meets the criteria for treatment in the [facility].    Recommendations  Based on my evaluation the patient does not appear to have an emergency medical condition.  Disposition: Admit patient to the facility base crisis unit.  Patient is requesting assistance with medications that could possibly help him sleep.  In addition he cannot contract for safety at this time.  Lab work ordered: CBC with differential, CMP, ethanol, hemoglobin A1c, lipid panel, magnesium, RPR, TSH, U/A, UDS, GC chlamydia, COVID POC and PCR.    Creatinine 0.79, GFR >60   EKG- Vent. rate 60 BPM PR interval 126 ms QRS duration 80 ms QT/QTcB 478/478 ms P-R-T axes 0 -24 9  Restarted home medications: Atenolol 25 mg nightly, atorvastatin 40 mg nightly, NovoLog Mix 70/30 10 units SQ twice daily with meals, metformin 500 mg twice daily. CBG ordered BID (before  meals)  Anxiety- Ordered hydroxyzine 25 mg p.o. 3 times daily as needed   Insomnia- Ordered hydroxyzine 100 mg nightly and Seroquel 100 mg nightly   Ardis Hughs, NP 11/26/21  2:56 PM

## 2021-11-26 NOTE — ED Notes (Signed)
Pt admitted to [facility] endorsing increased anxiety,depression and insomnia. Patient  denies SI,HI,AVH at present. Patient was cooperative during the admission assessment. Skin assessment complete. Belongings in locker. Patient oriented to unit and unit rules. Meal and drinks offered to patient.  Patient verbalized agreement to treatment plans. Patient verbally contracts for safety while hospitalized. Will monitor for safety.

## 2021-11-26 NOTE — Patient Instructions (Addendum)
Please go immediately to: [facility] [place] [facility] the [facility]. [address] [place], [place] [address] [phone].

## 2021-11-26 NOTE — Progress Notes (Signed)
Subjective:    Patient ID: Eric Ortega, male    DOB: 02-May-1961, 61 y.o.   MRN: [id number]  Chief Complaint  Patient presents with   Anxiety   Insomnia    HPI 61 y.o. patient presents today for new patient establishment with me.  Patient was previously established with [facility]. States he was dismissed from their practice after three no-shows, two of which he says he was in the hospital, and he was not given warning.   Acute Concerns: Insomnia  -Unisom, Z-quil, trazodone - all failed -Can't fall asleep or rest -Panic / anxiety  -Previously on Celexa 40 mg, not taking this either   BS this morning was 150   Just started a new job yesterday. 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   He was taken off Gabapentin 1800 mg TID, amitriptyline, klonopin after hospitalization in December.   Stopped alcohol abuse 6 months ago.   Pt denies any mental health history in himself or his family. Mother's side of family hx of diabetes, alcoholism.   Enjoys ballroom dancing, but says this has changed in the last few weeks as well and he has not felt like participating or enjoying it.   Feels anxious, tearful, restless, panic-like.  Took 8 Unisom last night before bed and didn't fall asleep. Previously tried up to 400 mg Trazodone in one night and didn't fall asleep either.   Having thoughts about being better off dead. No plan. No attempts.    Past Medical History:  Diagnosis Date   Diabetes mellitus without complication ([place])    Hyperlipidemia    Hypertension     Past Surgical History:  Procedure Laterality Date   APPENDECTOMY      Family History  Problem Relation Age of Onset   Diabetes Mother    Heart failure Mother    Heart failure Father     Social History   Tobacco Use   Smoking status: Former   Smokeless tobacco: Never  Scientific laboratory technician Use: Never used  Substance Use Topics   Alcohol use: No   Drug use: No     No  Known Allergies  Review of Systems NEGATIVE UNLESS OTHERWISE INDICATED IN HPI      Objective:     BP (!) 156/71    Pulse (!) 57    Temp 98.2 F (36.8 C)    Ht 6' (1.829 m)    Wt 203 lb (92.1 kg)    BMI 27.53 kg/m   Wt Readings from Last 3 Encounters:  11/26/21 203 lb (92.1 kg)  09/29/21 208 lb 5.4 oz (94.5 kg)  10/19/20 241 lb (109.3 kg)    BP Readings from Last 3 Encounters:  11/26/21 (!) 156/71  10/02/21 131/78  10/19/20 (!) 225/97     Physical Exam Vitals and nursing note reviewed.  Constitutional:      General: He is not in acute distress.    Appearance: Normal appearance. He is not toxic-appearing.  HENT:     Head: Normocephalic and atraumatic.  Eyes:     Extraocular Movements: Extraocular movements intact.     Conjunctiva/sclera: Conjunctivae normal.     Pupils: Pupils are equal, round, and reactive to light.  Cardiovascular:     Rate and Rhythm: Regular rhythm. Bradycardia present.     Pulses: Normal pulses.     Heart sounds: Normal heart sounds.  Pulmonary:     Effort: Pulmonary effort is normal.  Breath sounds: Normal breath sounds.  Musculoskeletal:        General: Normal range of motion.     Cervical back: Normal range of motion and neck supple.  Skin:    General: Skin is warm and dry.  Neurological:     General: No focal deficit present.     Mental Status: He is alert and oriented to person, place, and time.  Psychiatric:        Mood and Affect: Mood is anxious and depressed. Affect is tearful.        Speech: Speech normal.        Thought Content: Thought content includes suicidal ideation. Thought content does not include homicidal or suicidal plan.       Assessment & Plan:   Problem List Items Addressed This Visit   None Visit Diagnoses     Suicidal thoughts    -  Primary   Acute insomnia       Anxiety and depression           Plan: -New patient establishment -I'm very concerned about his current state. He expresses thoughts  of suicide, but no plan, he is very tearful and anxious. His GAD scores and PHQ9 scores are 21 and 24 respectively today. -He reports several weeks of insomnia, panic attacks, anxiety, restlessness. Symptoms have been progressively worsening since hospitalization 09/29/21 and several medications were discontinued at that time. -Given all of this, I strongly advised urgent assistance from behavioral health and directed him to the [facility] transportation, but patient declined and said that he felt comfortable driving himself straight there. My CMA called ahead of his arrival.    Randa Evens Jaquis Picklesimer, PA-C

## 2021-11-26 NOTE — ED Notes (Signed)
Patient was admitted on to the unit right at the start of shift, and immediately attended AA group and gave positive feedback. Staff commended patient for interacting and communicating in a positive manner during group.

## 2021-11-26 NOTE — Discharge Instructions (Addendum)
Patient is instructed prior to discharge to: Take all medications as prescribed by his/her mental healthcare provider. Report any adverse effects and or reactions from the medicines to his/her outpatient provider promptly. Patient has been instructed & cautioned: To not engage in alcohol and or illegal drug use while on prescription medicines. In the event of worsening symptoms, patient is instructed to call the crisis hotline, 911 and or go to the nearest ED for appropriate evaluation and treatment of symptoms. To follow-up with his/her primary care provider for your other medical issues, concerns and or health care needs.  Discharge recommendations: Patient is to take medications as prescribed. Please see information for follow-up appointment with psychiatry and therapy. Please follow up with your primary care provider for all medical related needs. Therapy: We recommend that patient participate in individual therapy to address mental health concerns. Medications: The patient is to contact a medical professional and/or outpatient provider to address any new side effects that develop. Patient should update outpatient providers of any new medications and/or medication changes. Safety: The patient should abstain from use of illicit substances/drugs and abuse of any medications. If symptoms worsen or do not continue to improve or if the patient becomes actively suicidal or homicidal then it is recommended that the patient return to the closest hospital emergency department, the [facility], or call 911 for further evaluation and treatment. National Suicide Prevention Lifeline 1-800-SUICIDE or 463-346-1279. About 988 988 offers 24/7 access to trained crisis counselors who can help people experiencing mental health-related distress. People can call or text 988 or chat 988lifeline.org for themselves or if they are worried about a loved one who may need  crisis support. Please contact one of the following facilities to start medication management and therapy services: (with insurance)  [facility] at [facility] [address] [place]  [place], [place] [address] [phone]   [facility]  [address] [place], [place] [address] [phone]  [facility]  [address] [place], [place] [address] [phone]  [facility]  [address]  [place], [place] [address] [phone]  [facility]  [address] [place], [place] [address] [phone]  [facility]  [address],  [place], [place] [address] [phone]   Please contact one of the following facilities to start medication management and therapy services: (without insurance)  [facility] [address] ([address] [place])  [place], [place]  [address] Phone: [phone]  [facility]  [address] [place], [place] [address] Phone: [phone]  [facility]  [address]  [place], [place] [address]  [facility]  [address]  [place], [place] [address] Phone: [phone]

## 2021-11-26 NOTE — BH Assessment (Incomplete)
Comprehensive Clinical Assessment (CCA) Note  11/26/2021 Eric Ortega [id number]  Disposition: Per Thomes Lolling, NP admission to [facility] is recommended for safety and stabilization.    The patient demonstrates the following risk factors for suicide: Chronic risk factors for suicide include: psychiatric disorder of Anxiety Disorder Unspecified, insomnia , medical illness of diabetes and insomnia, following med adjustments, and demographic factors (male, >52 y/o). Acute risk factors for suicide include:  insomnia, 1-2 hours of sleep for 4 wks . Protective factors for this patient include: positive social support, coping skills, and hope for the future. Considering these factors, the overall suicide risk at this point appears to be moderate. Patient is appropriate for outpatient follow up once stabilized.   Patient is a 61 year old male with a history of @,@,@ who presents voluntarily/involuntarily to [facility] for assessment.  Patient reports @,@,@.  Patient endorses/denies SI, HI, AVH.  She/he reports history of past attempts, with most recent @.  Patient has a hx of SA:  Last use was.    Patient is able/unable to contract for safety outside of the hospital.    Patient gives verbal consent for [facility] to contact @.  Per @, patient @,@,@.    Per (provider), patient meets/does not meet criteria for inpatient treatment.       Chief Complaint: No chief complaint on file.  Visit Diagnosis: ***  Flowsheet Row ED from 11/26/2021 in [facility]  Thoughts that you would be better off dead, or of hurting yourself in some way Several days  PHQ-9 Total Score 16      [place] ED from 11/26/2021 in [facility] ED to Hosp-Admission (Discharged) from 09/29/2021 in [place] CATEGORY Error: Q3, 4, or 5 should not be populated when Q2 is No No Risk     Risk = Moderate - score is  not populating    CCA Screening, Triage and Referral (STR)  Patient Reported Information How did you hear about [place]? Self  What Is the Reason for Your Visit/Call Today? Patient presents for assessment reporting insomnia for the past 4 weeks.  He states he was hospitalized 12/25 after a fall and while in the hospital, several medications were d/c some adjusted.  He has diabetes and was changed from metformin to insulin during this admission. Gabapentin, amatryptaline and were discontinued.  Patient states he been experiencing anxiety form 3 months, stating he has no hx of anxiety or depression prior to this recent onset of symptoms. Patient was prescribed trazadone recently, however this has not helped with sleep.  He took 400 mg of trazadone two nights ago and 400 mg of unisom last night, and he has had no sleep either night. 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  Patient denies having a suicide plan, he denies HI, AVH or SA hx. He describes feeling "desperate" at thie point, feeling there is no relief in sight with sleep issues.  Just the idea of going home to "try to get sleep again, knowing I won't" feels hopeless.  Patient is unable to affirm his safety to return home at this time.  He is hopeful that there might be a medication adjustment or change that could help  with sleep.  How Long Has This Been Causing You Problems? 1-6 months  What Do You Feel Would Help You the Most Today? Medication(s)   Have You Recently Had Any Thoughts About Hurting Yourself? Yes  Are You Planning to Commit Suicide/Harm Yourself At This time? No   Have you Recently Had Thoughts About [place]? No  Are You Planning to Harm Someone at This Time? No  Explanation: No data recorded  Have You Used Any  Alcohol or Drugs in the Past 24 Hours? No  How Long Ago Did You Use Drugs or Alcohol? No data recorded What Did You Use and How Much? No data recorded  Do You Currently Have a Therapist/Psychiatrist? No  Name of Therapist/Psychiatrist: No data recorded  Have You Been Recently Discharged From Any Office Practice or Programs? No  Explanation of Discharge From Practice/Program: No data recorded    CCA Screening Triage Referral Assessment Type of Contact: Face-to-Face  Telemedicine Service Delivery:   Is this Initial or Reassessment? No data recorded Date Telepsych consult ordered in [facility]:  No data recorded Time Telepsych consult ordered in [facility]:  No data recorded Location of Assessment: [facility] Assessment Services  Provider Location: [facility] Assessment Services   Collateral Involvement: No data recorded  Does Patient Have a [place]? No data recorded Name and Contact of Legal Guardian: No data recorded If Minor and Not Living with Parent(s), Who has Custody? No data recorded Is CPS involved or ever been involved? Never  Is APS involved or ever been involved? Never   Patient Determined To Be At Risk for Harm To Self or Others Based on Review of Patient Reported Information or Presenting Complaint? Yes, for Self-Harm  Method: No data recorded Availability of Means: No data recorded Intent: No data recorded Notification Required: No data recorded Additional Information for Danger to Others Potential: No data recorded Additional Comments for Danger to Others Potential: No data recorded Are There Guns or Other Weapons in Your Home? No data recorded Types of Guns/Weapons: No data recorded Are These Weapons Safely Secured?                            No data recorded Who Could Verify You Are Able To Have These Secured: No data recorded Do You Have any Outstanding Charges, Pending Court Dates, Parole/Probation? No data recorded Contacted To Inform of Risk of  Harm To Self or Others: Other: Comment [facility] staff aware of safety concerns)    Does Patient Present under Involuntary Commitment? No  IVC Papers Initial File Date: No data recorded  [place] of Residence: [place]   Patient Currently Receiving the Following Services: Medication Management (PCP manages meds)   Determination of Need: Routine (7 days)   Options For Referral: Medication Management; Outpatient Therapy     CCA Biopsychosocial Patient Reported Schizophrenia/Schizoaffective Diagnosis in Past: No   Strengths: Patient is seeking treatment, has support   Mental Health Symptoms Depression:   Hopelessness   Duration of Depressive symptoms:  Duration of Depressive Symptoms: Greater than two weeks   Mania:   None   Anxiety:    Worrying; Tension; Sleep; Restlessness; Difficulty concentrating   Psychosis:   None   Duration of Psychotic symptoms:    Trauma:   None   Obsessions:   None   Compulsions:   None   Inattention:   N/A   Hyperactivity/Impulsivity:   N/A   Oppositional/Defiant  Behaviors:   N/A   Emotional Irregularity:   None   Other Mood/Personality Symptoms:   Worsening anxiety related to insomnia for 4 wks    Mental Status Exam Appearance and self-care  Stature:   Tall   Weight:   Average weight   Clothing:   Neat/clean   Grooming:   Well-groomed   Cosmetic use:   None   Posture/gait:   Normal   Motor activity:   Not Remarkable   Sensorium  Attention:   Normal   Concentration:   Anxiety interferes; Variable   Orientation:   X5   Recall/memory:   Normal   Affect and Mood  Affect:   Anxious; Depressed   Mood:   Depressed; Anxious   Relating  Eye contact:   Normal   Facial expression:   Anxious; Responsive   Attitude toward examiner:   Cooperative   Thought and Language  Speech flow:  Clear and Coherent   Thought content:   Appropriate to Mood and Circumstances   Preoccupation:    None   Hallucinations:   None   Organization:  No data recorded  [employer] of Knowledge:   Average   Intelligence:   Average   Abstraction:   Normal   Judgement:   Impaired; Fair   Reality Testing:   Adequate   Insight:   Fair   Decision Making:   Normal   Social Functioning  Social Maturity:   Responsible   Social Judgement:   Normal   Stress  Stressors:   Other (Comment) (insomnia)   Coping Ability:   Exhausted; Overwhelmed   Skill Deficits:   Decision making; Self-control   Supports:   Family; Friends/Service system     Religion: Religion/Spirituality Are You A Religious Person?: No  Leisure/Recreation: Leisure / Recreation Do You Have Hobbies?: No  Exercise/Diet: Exercise/Diet Do You Exercise?: No Have You Gained or Lost A Significant Amount of Weight in the Past Six Months?: No Do You Follow a Special Diet?: No Do You Have Any Trouble Sleeping?: Yes Explanation of Sleeping Difficulties: sleeps 1-2 hours per night for past 4 weeks   CCA Employment/Education Employment/Work Situation: Employment / Work Situation Employment Situation: Employed Work Stressors: None, outside of this being 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 Patient's Job has Been Impacted by Current Illness: Yes Describe how Patient's Job has Been Impacted: Unable to go in today due to sx Has Patient ever Been in the [place]?: No  Education: Education Is Patient Currently Attending School?: No Last Grade Completed: 16 Did You Attend College?: Yes What Type of College Degree Do you Have?: NA Did You Have An Individualized Education Program (IIEP): No Did You Have Any Difficulty At School?: No Patient's Education Has Been Impacted by Current Illness: No   CCA Family/Childhood History Family and Relationship History: Family history Marital status: Divorced Divorced, when?: 6 yrs ago What types of issues is patient  dealing with in the relationship?: NA Additional relationship information: NA Does patient have children?: Yes How many children?: 4 How is patient's relationship with their children?: No concerns  Childhood History:  Childhood History By whom was/is the patient raised?: Both parents Did patient suffer any verbal/emotional/physical/sexual abuse as a child?: No Did patient suffer from severe childhood neglect?: No Has patient ever been sexually abused/assaulted/raped as an adolescent or adult?: No Was the patient ever a victim of a crime or a disaster?: No Witnessed domestic violence?: No  Has patient been affected by domestic violence as an adult?: No  Child/Adolescent Assessment:     CCA Substance Use Alcohol/Drug Use: Alcohol / Drug Use Pain Medications: See MAR Prescriptions: See MAR Over the Counter: See MAR History of alcohol / drug use?: No history of alcohol / drug abuse                         ASAM's:  Six Dimensions of Multidimensional Assessment  Dimension 1:  Acute Intoxication and/or Withdrawal Potential:      Dimension 2:  Biomedical Conditions and Complications:      Dimension 3:  Emotional, Behavioral, or Cognitive Conditions and Complications:     Dimension 4:  Readiness to Change:     Dimension 5:  Relapse, Continued use, or Continued Problem Potential:     Dimension 6:  Recovery/Living Environment:     ASAM Severity Score:    ASAM Recommended Level of Treatment:     Substance use Disorder (SUD)    Recommendations for Services/Supports/Treatments:    Discharge Disposition:    DSM5 Diagnoses: Patient Active Problem List   Diagnosis Date Noted   MDD (major depressive disorder), recurrent severe, without psychosis ([place]) 11/26/2021   Acute encephalopathy 09/29/2021   AKI (acute kidney injury) ([place])    Confusion    Hypotension    Hyperglycemia due to diabetes mellitus ([place]) 10/23/2020     Referrals to Alternative  Service(s): Referred to Alternative Service(s):   Place:   Date:   Time:    Referred to Alternative Service(s):   Place:   Date:   Time:    Referred to Alternative Service(s):   Place:   Date:   Time:    Referred to Alternative Service(s):   Place:   Date:   Time:     Fransico Meadow, [facility]

## 2021-11-26 NOTE — Progress Notes (Signed)
°   11/26/21 1351  °[facility] Triage Screening (Walk-ins at [facility] only)  °How Did You Hear About Us? Self  °What Is the Reason for Your Visit/Call Today? Patient presents for assessment reporting insomnia for the past 4 weeks.  He states he was hospitalized 12/25 after a fall and while in the hospital, several medications were d/c some adjusted.  He has diabetes and was changed from metformin to insulin during this admission. Gabapentin, amatryptaline and were discontinued.  Patient states he been experiencing anxiety form 3 months, stating he has no hx of anxiety or depression prior to this recent onset of symptoms. Patient was prescribed trazadone recently, however this has not helped with sleep.  He took 400 mg of trazadone two nights ago and 400 mg of unisom last night, and he has had no sleep either night. 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  Patient denies having a suicide plan, he denies HI, AVH or SA hx. He describes feeling "desperate" at thie point, feeling there is no relief in sight with sleep issues.  Just the idea of going home to "try to get sleep again, knowing I won't" feels hopeless.  Patient is unable to affirm his safety to return home at this time.  He is hopeful that there might be a medication adjustment or change that could help with sleep.  °How Long Has This Been Causing You Problems? 1-6 months  °Have You Recently Had Any Thoughts About Hurting Yourself? Yes  °How long ago did you have thoughts about hurting yourself? Passive SI, associated with hopelessness about not being able to sleep.  °Are You Planning to Commit Suicide/Harm Yourself At This time? No  °Have you Recently Had Thoughts About Hurting Someone Else? No  °Are You Planning To Harm Someone At This Time? No  °Are you currently  experiencing any auditory, visual or other hallucinations? No  °Have You Used Any Alcohol or Drugs in the Past 24 Hours? No  °Do you have any current medical co-morbidities that require immediate attention? No  °Clinician description of patient physical appearance/behavior: Patient is well-groomed, pleasant, anxious and AAOx5.  °What Do You Feel Would Help You the Most Today? Medication(s)  °If access to [facility] was not available, would you have sought care in the Emergency Department? No  °Determination of Need Routine (7 days)  °Options For Referral Medication Management;Outpatient Therapy  ° ° °

## 2021-11-26 NOTE — Progress Notes (Signed)
°   11/26/21 1351  [facility] Triage Screening (Walk-ins at [facility] only)  How Did You Hear About [place]? Self  What Is the Reason for Your Visit/Call Today? Patient presents for assessment reporting insomnia for the past 4 weeks.  He states he was hospitalized 12/25 after a fall and while in the hospital, several medications were d/c some adjusted.  He has diabetes and was changed from metformin to insulin during this admission. Gabapentin, amatryptaline and were discontinued.  Patient states he been experiencing anxiety form 3 months, stating he has no hx of anxiety or depression prior to this recent onset of symptoms. Patient was prescribed trazadone recently, however this has not helped with sleep.  He took 400 mg of trazadone two nights ago and 400 mg of unisom last night, and he has had no sleep either night. 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  Patient denies having a suicide plan, he denies HI, AVH or SA hx. He describes feeling "desperate" at thie point, feeling there is no relief in sight with sleep issues.  Just the idea of going home to "try to get sleep again, knowing I won't" feels hopeless.  Patient is unable to affirm his safety to return home at this time.  He is hopeful that there might be a medication adjustment or change that could help with sleep.  How Long Has This Been Causing You Problems? 1-6 months  Have You Recently Had Any Thoughts About Hurting Yourself? Yes  How long ago did you have thoughts about hurting yourself? Passive SI, associated with hopelessness about not being able to sleep.  Are You Planning to Commit Suicide/Harm Yourself At This time? No  Have you Recently Had Thoughts About Hurting Someone Karolee Ohs? No  Are You Planning To Harm Someone At This Time? No  Are you currently  experiencing any auditory, visual or other hallucinations? No  Have You Used Any Alcohol or Drugs in the Past 24 Hours? No  Do you have any current medical co-morbidities that require immediate attention? No  Clinician description of patient physical appearance/behavior: Patient is well-groomed, pleasant, anxious and AAOx5.  What Do You Feel Would Help You the Most Today? Medication(s)  If access to [facility]gent [facility] was not available, would you have sought care in the Emergency Department? No  Determination of Need Routine (7 days)  Options For Referral Medication Management;Outpatient Therapy

## 2021-11-27 LAB — GC/CHLAMYDIA PROBE AMP (~~LOC~~) NOT AT ARMC
Chlamydia: NEGATIVE
Comment: NEGATIVE
Comment: NORMAL
Neisseria Gonorrhea: NEGATIVE

## 2021-11-27 LAB — RPR: RPR Ser Ql: NONREACTIVE

## 2021-11-27 LAB — GLUCOSE, CAPILLARY
Glucose-Capillary: 154 mg/dL — ABNORMAL HIGH (ref 70–99)
Glucose-Capillary: 155 mg/dL — ABNORMAL HIGH (ref 70–99)

## 2021-11-27 MED ORDER — HYDROXYZINE HCL 25 MG PO TABS: 25.0000 mg | ORAL_TABLET | Freq: Three times a day (TID) | ORAL | 0 refills | Status: DC | PRN

## 2021-11-27 MED ORDER — HYDROXYZINE HCL 50 MG PO TABS: 100.0000 mg | ORAL_TABLET | Freq: Every evening | ORAL | 0 refills | Status: DC | PRN

## 2021-11-27 MED ORDER — QUETIAPINE FUMARATE 100 MG PO TABS
100.0000 mg | ORAL_TABLET | Freq: Every day | ORAL | 0 refills | Status: DC
Start: 2021-11-27 — End: 2021-12-10

## 2021-11-27 NOTE — Clinical Social Work Psych Note (Addendum)
LCSW Initial/Discharge Note  LCSW met with Eric Ortega for introduction and to begin discussions regarding possible treatment.   Eric Ortega presented with an euthymic affect, congruent mood. Eric Ortega denied having any SI, HI or AVH at this time. He reports he was able to get a good night's rest.   Eric Ortega shared that he came to the [facility] seeking treatment for insomnia. Eric Ortega reported receiving 1-2 hours of sleep for the past 4 weeks. 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   Eric Ortega requested to be discharged due to receiving treatment that addressed his insomnia. Eric Ortega denied having any mental or physical complaints. He reports his medications appeared to be effective and would like to continue care on an outpatient basis.   Per Eric Lolling, NP the patient was psychiatrically cleared and was appropriate for discharge. Eric Ortega reports he planned on returning home with his mother. His car was parked in the [facility] parking lot.    Eric Ortega was provided with resources for outpatient medication management and therapy services. Eric Ortega shared that he plans to continue to follow up with his PCP at [facility].   There were no additional questions or concerns.    LCSW signing off.    Eric Ortega, MSW, LCSW Clinical Education officer, museum ([place]) [facility]

## 2021-11-27 NOTE — ED Notes (Addendum)
Pt stated he has heart fluttering on the left side of his chest and he is having restlessness in his legs. He refused the trazodone he said it doesn't work  for him. Vital Signs BP 103/57, Pulse 67, O2 99  and respiration 18. Provider Nira Conn NP notified. Will continue to monitor pt for safety.

## 2021-11-27 NOTE — ED Notes (Signed)
Pt is in the bed sleeping. Respirations are even and unlabored. No acute distress noted. Will continue to monitor for safety. 

## 2021-11-27 NOTE — Discharge Summary (Signed)
Eric Ortega to be D/C'd Home per NP order. Discussed with the patient and all questions fully answered. An After Visit Summary was printed and given to the patient. Patient escorted out and D/C home via private auto.  [name]  11/27/2021 10:02 AM

## 2021-11-27 NOTE — ED Provider Notes (Signed)
FBC/OBS ASAP Discharge Summary  Date and Time: 11/27/2021 8:59 AM  Name: Eric Ortega  MRN:  [id number]   Discharge Diagnoses:  Final diagnoses:  Severe episode of recurrent major depressive disorder, without psychotic features ([place])  Insomnia, unspecified type    Subjective: Eric Ortega, 61 y.o., male patient who originally presented to [facility] with complaints of increased anxiety, depression and insomnia on 11/26/2021.  He was admitted to the facility based crisis unit. Patient seen face to face by this provider, chart reviewed and discussed with treatment team on 11/27/21.  Patient has a past psychiatric history of anxiety and depression.  Reports his anxiety began 10 years ago when he got stuck in a "turn style" and was there for 3 hours.  He has a history of 2 strokes, diabetes and hypertension.   On today's evaluation Eric Ortega states, "I feel refreshed".  Reports he felt anxious last night and had some "flutters", which he has had before.  After taking the hydroxyzine and Seroquel he was able to get to sleep and slept 6 hours.  States he has not slept for 6 hours in over a month.  He tolerated medications without any adverse reactions.  Reports he talked to his employer and he has to show up today by 1:00 with a work note or he will lose his job.  Patient is requesting to be discharged.  Patient has remained calm/cooperative while on the unit. He is alert/oriented x 4, with pleasant affect, and does not appear to be responding to internal/external stimuli. He denies AVH/HI.  He continues to endorse some depression and has asked for outpatient psychiatric resources for a provider which will be given.  Reports PRN hydroxyzine has helped him to manage his anxiety during the day.  Overall patient states he feels stable.  He is denying suicidal ideations.  He denies any intent, plan, or access to means.  He contracts for safety. Patient attended/participating in [place] group session while on the unit.  Reassurance, support, and encouragement provided.   Stay Summary:   Patient remained calm and cooperative while on the unit.  He interacted with staff and other patients appropriately.  Reports hydroxyzine and Seroquel were effective in helping him sleep. He is requesting to be discharged.  He will be provided with prescriptions for hydroxyzine 25 mg p.o. TID PRN for anxiety, hydroxyzine 100 mg QHS PRN, and Seroquel 100 mg QHS for sleep, anxiety and mood stabilization.   Upon completion of this admission the Eric Ortega was both mentally and medically stable for discharge denying suicidal/homicidal ideation, auditory/visual/tactile hallucinations, delusional thoughts and paranoia.     Total Time spent with patient: 30 minutes  Past Psychiatric History: see H&P Past Medical History:  Past Medical History:  Diagnosis Date   Diabetes mellitus without complication ([place])    Hyperlipidemia    Hypertension     Past Surgical History:  Procedure Laterality Date   APPENDECTOMY     Family History:  Family History  Problem Relation Age of Onset   Diabetes Mother    Heart failure Mother    Heart failure Father    Family Psychiatric History: See H&P Social History:  Social History   Substance and Sexual Activity  Alcohol Use No     Social History   Substance and Sexual Activity  Drug Use No    Social History   Socioeconomic History   Marital status: Married    Spouse name: Not on file   Number of children:  Not on file   Years of education: Not on file   Highest education level: Not on file  Occupational History   Not on file  Tobacco Use   Smoking status: Former   Smokeless tobacco: Never  Vaping Use   Vaping Use: Never used  Substance and Sexual Activity   Alcohol use: No   Drug use: No   Sexual activity: Not Currently  Other Topics Concern   Not on file  Social History Narrative   Not on file   Social Determinants of Health   Financial Resource Strain: Not on  file  Food Insecurity: Not on file  Transportation Needs: Not on file  Physical Activity: Not on file  Stress: Not on file  Social Connections: Not on file   SDOH:  SDOH Screenings   Alcohol Screen: Not on file  Depression (PHQ2-9): Medium Risk   PHQ-2 Score: 24  Financial Resource Strain: Not on file  Food Insecurity: Not on file  Housing: Not on file  Physical Activity: Not on file  Social Connections: Not on file  Stress: Not on file  Tobacco Use: Medium Risk   Smoking Tobacco Use: Former   Smokeless Tobacco Use: Never   Passive Exposure: Not on file  Transportation Needs: Not on file    Tobacco Cessation:  N/A, patient does not currently use tobacco products  Current Medications:  Current Facility-Administered Medications  Medication Dose Route Frequency Provider Last Rate Last Admin   acetaminophen (TYLENOL) tablet 650 mg  650 mg Oral Q6H PRN Revonda Humphrey, NP       alum & mag hydroxide-simeth (MAALOX/MYLANTA) 200-200-20 MG/5ML suspension 30 mL  30 mL Oral Q4H PRN Revonda Humphrey, NP       atenolol (TENORMIN) tablet 25 mg  25 mg Oral QHS Thomes Lolling H, NP   25 mg at 11/26/21 2143   atorvastatin (LIPITOR) tablet 40 mg  40 mg Oral QHS Revonda Humphrey, NP   40 mg at 11/26/21 2144   hydrOXYzine (ATARAX) tablet 100 mg  100 mg Oral QHS Revonda Humphrey, NP   100 mg at 11/26/21 2144   hydrOXYzine (ATARAX) tablet 25 mg  25 mg Oral TID PRN Revonda Humphrey, NP   25 mg at 11/26/21 1537   insulin aspart protamine- aspart (NOVOLOG MIX 70/30) injection 10 Units  10 Units Subcutaneous BID WC Revonda Humphrey, NP   10 Units at 11/27/21 0830   magnesium hydroxide (MILK OF MAGNESIA) suspension 30 mL  30 mL Oral Daily PRN Revonda Humphrey, NP       metFORMIN (GLUCOPHAGE) tablet 500 mg  500 mg Oral BID Revonda Humphrey, NP   500 mg at 11/27/21 5732   QUEtiapine (SEROQUEL) tablet 100 mg  100 mg Oral QHS Revonda Humphrey, NP   100 mg at 11/26/21 2144   Current  Outpatient Medications  Medication Sig Dispense Refill   atenolol (TENORMIN) 25 MG tablet Take 1 tablet (25 mg total) by mouth at bedtime. 30 tablet 0   atorvastatin (LIPITOR) 40 MG tablet Take 1 tablet (40 mg total) by mouth daily. 30 tablet 0   Blood Glucose Monitoring Suppl (ONETOUCH VERIO) w/Device KIT 1 each by Does not apply route as needed.     insulin aspart protamine- aspart (NOVOLOG MIX 70/30) (70-30) 100 UNIT/ML injection Inject 0.1 mLs (10 Units total) into the skin 2 (two) times daily with a meal. 10 mL 11   metFORMIN (GLUCOPHAGE) 500 MG tablet  Take 500 mg by mouth 2 (two) times daily.     ONETOUCH DELICA LANCETS 96G MISC 1 each by Does not apply route as needed.     traZODone (DESYREL) 50 MG tablet Take 25-50 mg by mouth at bedtime.      PTA Medications: (Not in a hospital admission)   Musculoskeletal  Strength & Muscle Tone: within normal limits Gait & Station: normal Patient leans: N/A  Psychiatric Specialty Exam  Presentation  General Appearance: Appropriate for Environment; Casual  Eye Contact:Good  Speech:Clear and Coherent; Normal Rate  Speech Volume:Normal  Handedness:Right   Mood and Affect  Mood:Anxious  Affect:Congruent   Thought Process  Thought Processes:Coherent  Descriptions of Associations:Intact  Orientation:Full (Time, Place and Person)  Thought Content:Logical  Diagnosis of Schizophrenia or Schizoaffective disorder in past: No    Hallucinations:Hallucinations: None  Ideas of Reference:None  Suicidal Thoughts:Suicidal Thoughts: No SI Passive Intent and/or Plan: Without Intent; Without Plan; Without Means to Carry Out  Homicidal Thoughts:Homicidal Thoughts: No   Sensorium  Memory:Immediate Good; Recent Good; Remote Good  Judgment:Good  Insight:Good   Executive Functions  Concentration:Good  Attention Span:Good  [place] of Knowledge:Good  Language:Good   Psychomotor Activity  Psychomotor  Activity:Psychomotor Activity: Normal   Assets  Assets:Communication Skills; Desire for Improvement; Financial Resources/Insurance; Housing; Leisure Time; Physical Health; Resilience; Social Support; Vocational/Educational   Sleep  Sleep:Sleep: Fair Number of Hours of Sleep: 6   Nutritional Assessment (For OBS and FBC admissions only) Has the patient had a weight loss or gain of 10 pounds or more in the last 3 months?: Yes Has the patient had a decrease in food intake/or appetite?: Yes Does the patient have dental problems?: No Does the patient have eating habits or behaviors that may be indicators of an eating disorder including binging or inducing vomiting?: No Has the patient recently lost weight without trying?: 4 Has the patient been eating poorly because of a decreased appetite?: 1 Malnutrition Screening Tool Score: 5 Nutritional Assessment Referrals: Refer for Nutritional Consult to Nutrition and Diabetes Service    Physical Exam  Physical Exam Vitals and nursing note reviewed.  Constitutional:      General: He is not in acute distress.    Appearance: Normal appearance. He is well-developed.  HENT:     Head: Normocephalic and atraumatic.  Eyes:     General:        Right eye: No discharge.        Left eye: No discharge.     Conjunctiva/sclera: Conjunctivae normal.  Cardiovascular:     Rate and Rhythm: Normal rate.  Pulmonary:     Effort: Pulmonary effort is normal. No respiratory distress.  Musculoskeletal:        General: Normal range of motion.     Cervical back: Normal range of motion.  Skin:    Coloration: Skin is not jaundiced or pale.  Neurological:     Mental Status: He is alert and oriented to person, place, and time.  Psychiatric:        Attention and Perception: Attention normal.        Mood and Affect: Mood is anxious.        Speech: Speech normal.        Behavior: Behavior is not agitated. Behavior is cooperative.        Thought Content: Thought  content normal.        Cognition and Memory: Cognition normal.        Judgment: Judgment  normal.   Review of Systems  Constitutional: Negative.   HENT: Negative.    Eyes: Negative.   Respiratory: Negative.    Cardiovascular: Negative.   Musculoskeletal: Negative.   Skin: Negative.   Neurological: Negative.   Psychiatric/Behavioral:  The patient is nervous/anxious.   Blood pressure 126/70, pulse 64, temperature 97.7 F (36.5 C), temperature source Temporal, resp. rate 18, SpO2 99 %. There is no height or weight on file to calculate BMI.  Demographic Factors:  Male and Caucasian  Loss Factors: NA  Historical Factors: NA  Risk Reduction Factors:   Sense of responsibility to family, Religious beliefs about death, Employed, Living with another person, especially a relative, Positive social support, Positive therapeutic relationship, and Positive coping skills or problem solving skills  Continued Clinical Symptoms:  Severe Anxiety and/or Agitation Depression:   Insomnia  Cognitive Features That Contribute To Risk:  None    Suicide Risk:  Minimal: No identifiable suicidal ideation.  Patients presenting with no risk factors but with morbid ruminations; may be classified as minimal risk based on the severity of the depressive symptoms  Plan Of Care/Follow-up recommendations:  Activity:  as tolerated  Diet:  regular   Disposition: Discharge patient.   Outpatient psychiatric resources were provided for medication management and therapy.   Prescriptions for hydroxyzine 25 mg p.o. TID PRN for anxiety, hydroxyzine 100 mg QHS PRN for sleep, and Seroquel 100 mg QHS for sleep, anxiety and mood stabilization sent to patients pharmacy.   No evidence of imminent risk to self or others at present.    Patient does not meet criteria for psychiatric inpatient admission. Discussed crisis plan, support from social network, calling 911, coming to the Emergency Department, and calling Suicide  Hotline.    Revonda Humphrey, NP 11/27/2021, 8:59 AM

## 2021-11-27 NOTE — Progress Notes (Signed)
Pt is awake, alert and oriented. Pt did not voice any complaints of pain or discomfort. No signs of acute distress noted. Administered scheduled meds with no incident. Pt denies current SI/HI/AVH. Staff will monitor for pt's safety. 

## 2021-11-27 NOTE — ED Triage Notes (Signed)
Patient reports that his heart is fluttering. Vital signs stable. EKG-Normal Sinus Rhythm. No significant ST elevation/depression.

## 2021-11-28 LAB — GLUCOSE, CAPILLARY: Glucose-Capillary: 184 mg/dL — ABNORMAL HIGH (ref 70–99)

## 2021-12-06 ENCOUNTER — Ambulatory Visit: Payer: Self-pay | Admitting: Physician Assistant

## 2021-12-10 ENCOUNTER — Other Ambulatory Visit: Payer: Self-pay

## 2021-12-10 ENCOUNTER — Encounter: Payer: Self-pay | Admitting: Physician Assistant

## 2021-12-10 ENCOUNTER — Ambulatory Visit (INDEPENDENT_AMBULATORY_CARE_PROVIDER_SITE_OTHER): Payer: Managed Care, Other (non HMO) | Admitting: Physician Assistant

## 2021-12-10 VITALS — BP 132/76 | HR 56 | Temp 98.2°F | Ht 72.0 in | Wt 203.4 lb

## 2021-12-10 DIAGNOSIS — F32A Depression, unspecified: Secondary | ICD-10-CM | POA: Diagnosis not present

## 2021-12-10 DIAGNOSIS — F419 Anxiety disorder, unspecified: Secondary | ICD-10-CM

## 2021-12-10 DIAGNOSIS — G4701 Insomnia due to medical condition: Secondary | ICD-10-CM | POA: Diagnosis not present

## 2021-12-10 MED ORDER — ATORVASTATIN CALCIUM 40 MG PO TABS: 40.0000 mg | ORAL_TABLET | Freq: Every day | ORAL | 1 refills | Status: DC

## 2021-12-10 MED ORDER — QUETIAPINE FUMARATE 100 MG PO TABS: 100.0000 mg | ORAL_TABLET | Freq: Every day | ORAL | 0 refills | Status: DC

## 2021-12-10 MED ORDER — ATENOLOL 25 MG PO TABS: 25.0000 mg | ORAL_TABLET | Freq: Every day | ORAL | 1 refills | Status: DC

## 2021-12-10 MED ORDER — METFORMIN HCL 500 MG PO TABS: 500.0000 mg | ORAL_TABLET | Freq: Two times a day (BID) | ORAL | 1 refills | Status: DC

## 2021-12-10 MED ORDER — HYDROXYZINE HCL 50 MG PO TABS: 100.0000 mg | ORAL_TABLET | Freq: Every evening | ORAL | 0 refills | Status: DC | PRN

## 2021-12-10 MED ORDER — HYDROXYZINE HCL 25 MG PO TABS: 25.0000 mg | ORAL_TABLET | Freq: Three times a day (TID) | ORAL | 0 refills | Status: DC | PRN

## 2021-12-10 NOTE — Progress Notes (Signed)
? ?Subjective:  ? ? Patient ID: Eric Ortega, male    DOB: [birth year], 61 y.o.   MRN: [id number] ? ?Chief Complaint  ?Patient presents with  ? Insomnia  ? ? ?HPI ?Patient is in today for f/up from 11/26/2021. Patient presented to the [facility] after new patient visit with me that day for insomnia and suicidal ideations.  Patient states that he was able to sleep in their facility and started on medications that seem to be helping tremendously.  States that he feels like a new person now that his anxiety better and he is sleeping.   ? ?His current regimen includes: ?Hydroxyzine 25 mg up to TID, only has been taking one. ?Seroquel 100 mg at bedtime. ?Hydroxyzine 50 mg two tablets at bedtime. ? ?Slept 7 hours last night. No awakenings. Feeling much better.  ?Work is going well. Just had 9th grandchild born over the weekend. ?3 daughters and one son (this is his first baby).  ? ?BS 140 this morning.  ? ?He is also needing refills on some medications. ? ?He does not have follow-up scheduled with psychiatry, but is willing to pursue this. ? ?Past Medical History:  ?Diagnosis Date  ? Diabetes mellitus without complication (HCC)   ? Hyperlipidemia   ? Hypertension   ? ? ?Past Surgical History:  ?Procedure Laterality Date  ? APPENDECTOMY    ? ? ?Family History  ?Problem Relation Age of Onset  ? Diabetes Mother   ? Heart failure Mother   ? Heart failure Father   ? ? ?Social History  ? ?Tobacco Use  ? Smoking status: Former  ? Smokeless tobacco: Never  ?Vaping Use  ? Vaping Use: Never used  ?Substance Use Topics  ? Alcohol use: No  ? Drug use: No  ?  ? ?No Known Allergies ? ?Review of Systems ?NEGATIVE UNLESS OTHERWISE INDICATED IN HPI ? ? ?   ?Objective:  ?  ? ?BP 132/76   Pulse (!) 56   Temp 98.2 ?F (36.8 ?C)   Ht 6' (1.829 m)   Wt 203 lb 6.1 oz (92.3 kg)   SpO2 97%   BMI 27.58 kg/m?  ? ?Wt Readings from Last 3 Encounters:  ?12/10/21 203 lb 6.1 oz (92.3 kg)  ?11/26/21 203 lb (92.1 kg)   ?09/29/21 208 lb 5.4 oz (94.5 kg)  ? ? ?BP Readings from Last 3 Encounters:  ?12/10/21 132/76  ?11/27/21 126/70  ?11/26/21 (!) 156/71  ?  ? ?Physical Exam ?Vitals and nursing note reviewed.  ?Constitutional:   ?   General: He is not in acute distress. ?   Appearance: Normal appearance. He is not toxic-appearing.  ?HENT:  ?   Head: Normocephalic and atraumatic.  ?   Right Ear: External ear normal.  ?   Left Ear: External ear normal.  ?   Nose: Nose normal.  ?   Mouth/Throat:  ?   Mouth: Mucous membranes are moist.  ?   Pharynx: Oropharynx is clear.  ?Eyes:  ?   Extraocular Movements: Extraocular movements intact.  ?   Conjunctiva/sclera: Conjunctivae normal.  ?   Pupils: Pupils are equal, round, and reactive to light.  ?Cardiovascular:  ?   Rate and Rhythm: Normal rate and regular rhythm.  ?   Pulses: Normal pulses.  ?   Heart sounds: Normal heart sounds.  ?Pulmonary:  ?   Effort: Pulmonary effort is normal.  ?   Breath sounds: Normal breath sounds.  ?Musculoskeletal:     ?  General: Normal range of motion.  ?   Cervical back: Normal range of motion and neck supple.  ?Skin: ?   General: Skin is warm and dry.  ?Neurological:  ?   General: No focal deficit present.  ?   Mental Status: He is alert and oriented to person, place, and time.  ?Psychiatric:     ?   Mood and Affect: Mood normal.     ?   Behavior: Behavior normal.  ? ? ?   ?Assessment & Plan:  ? ?Problem List Items Addressed This Visit   ?None ?Visit Diagnoses   ? ? Anxiety and depression    -  Primary  ? Relevant Medications  ? hydrOXYzine (ATARAX) 25 MG tablet  ? hydrOXYzine (ATARAX) 50 MG tablet  ? Other Relevant Orders  ? Ambulatory referral to Psychiatry  ? Insomnia due to medical condition      ? Relevant Orders  ? Ambulatory referral to Psychiatry  ? ?  ? ? ? ?Meds ordered this encounter  ?Medications  ? atenolol (TENORMIN) 25 MG tablet  ?  Sig: Take 1 tablet (25 mg total) by mouth at bedtime.  ?  Dispense:  90 tablet  ?  Refill:  1  ? atorvastatin  (LIPITOR) 40 MG tablet  ?  Sig: Take 1 tablet (40 mg total) by mouth daily.  ?  Dispense:  90 tablet  ?  Refill:  1  ? metFORMIN (GLUCOPHAGE) 500 MG tablet  ?  Sig: Take 1 tablet (500 mg total) by mouth 2 (two) times daily.  ?  Dispense:  180 tablet  ?  Refill:  1  ? QUEtiapine (SEROQUEL) 100 MG tablet  ?  Sig: Take 1 tablet (100 mg total) by mouth at bedtime.  ?  Dispense:  30 tablet  ?  Refill:  0  ? hydrOXYzine (ATARAX) 25 MG tablet  ?  Sig: Take 1 tablet (25 mg total) by mouth 3 (three) times daily as needed for anxiety.  ?  Dispense:  90 tablet  ?  Refill:  0  ? hydrOXYzine (ATARAX) 50 MG tablet  ?  Sig: Take 2 tablets (100 mg total) by mouth at bedtime as needed.  ?  Dispense:  60 tablet  ?  Refill:  0  ? ?Plan:  ?-I personally reviewed his records from the behavioral health center. ?-I am glad that he is doing so much better.  Medications were refilled today accordingly. ?-I do think that he would benefit from follow-up with psychiatry as well and have placed this referral today with patient's permission. ?-Plan to follow-up in the next 2 to 3 months here for fasting labs and medication recheck.   ?-Discussed with patient concerns about Seroquel and possible side effects including tardive dyskinesia and lab effects.  We will continue to monitor here until he is able to get in with psychiatry. ?-He knows to call me sooner if he has any concerns. ?-He no longer has any suicidal ideations. ? ? ?This note was prepared with assistance of Conservation officer, historic buildings. Occasional wrong-word or sound-a-like substitutions may have occurred due to the inherent limitations of voice recognition software. ? ? ?Briele Lagasse M Chara Marquard, PA-C ?

## 2022-01-09 ENCOUNTER — Ambulatory Visit (INDEPENDENT_AMBULATORY_CARE_PROVIDER_SITE_OTHER): Payer: Managed Care, Other (non HMO) | Admitting: Physician Assistant

## 2022-01-09 VITALS — BP 133/80

## 2022-01-09 DIAGNOSIS — E1140 Type 2 diabetes mellitus with diabetic neuropathy, unspecified: Secondary | ICD-10-CM

## 2022-01-09 DIAGNOSIS — Z794 Long term (current) use of insulin: Secondary | ICD-10-CM

## 2022-01-09 DIAGNOSIS — R21 Rash and other nonspecific skin eruption: Secondary | ICD-10-CM | POA: Diagnosis not present

## 2022-01-09 MED ORDER — GABAPENTIN 300 MG PO CAPS: 300.0000 mg | ORAL_CAPSULE | Freq: Three times a day (TID) | ORAL | 3 refills | Status: DC

## 2022-01-09 MED ORDER — CLOTRIMAZOLE-BETAMETHASONE 1-0.05 % EX CREA: 1.0000 "application " | TOPICAL_CREAM | Freq: Every day | CUTANEOUS | 0 refills | Status: DC

## 2022-01-09 NOTE — Progress Notes (Signed)
? ?Subjective:  ? ? Patient ID: Eric Ortega, male    DOB: Jun 25, 1961, 61 y.o.   MRN: [id number] ? ?Chief Complaint  ?Patient presents with  ? Peripheral Neuropathy  ? ? ?HPI ?Patient is in today for diabetic neuropathy in his feet "for awhile". ?Treated previously with gabapentin - he was taken off this in Dec 2022 during hospitalization.  ?Feels like it's in his arms and hands now as well. ?Numb, tingling, cold feeling. ?Affecting his sleep.  ?Topical agents not helping.  ? ?Running 150-160 BS most days. 180 fasting BS this morning. Diet hasn't changed much. Still taking metformin and novolog. ? ?New rash L lower ankle x 3 months, improving slowly. Some itching with it.  ? ? ?Past Medical History:  ?Diagnosis Date  ? Diabetes mellitus without complication (HCC)   ? Hyperlipidemia   ? Hypertension   ? ? ?Past Surgical History:  ?Procedure Laterality Date  ? APPENDECTOMY    ? ? ?Family History  ?Problem Relation Age of Onset  ? Diabetes Mother   ? Heart failure Mother   ? Heart failure Father   ? ? ?Social History  ? ?Tobacco Use  ? Smoking status: Former  ? Smokeless tobacco: Never  ?Vaping Use  ? Vaping Use: Never used  ?Substance Use Topics  ? Alcohol use: No  ? Drug use: No  ?  ? ?No Known Allergies ? ?Review of Systems ?NEGATIVE UNLESS OTHERWISE INDICATED IN HPI ? ? ?   ?Objective:  ?  ? ?BP 133/80  ? ?Wt Readings from Last 3 Encounters:  ?12/10/21 203 lb 6.1 oz (92.3 kg)  ?11/26/21 203 lb (92.1 kg)  ?09/29/21 208 lb 5.4 oz (94.5 kg)  ? ? ?BP Readings from Last 3 Encounters:  ?01/09/22 133/80  ?12/10/21 132/76  ?11/26/21 (!) 156/71  ?  ? ?Physical Exam ?Constitutional:   ?   Appearance: Normal appearance.  ?Cardiovascular:  ?   Rate and Rhythm: Normal rate and regular rhythm.  ?   Pulses:     ?     Dorsalis pedis pulses are 2+ on the right side and 2+ on the left side.  ?     Posterior tibial pulses are 2+ on the right side and 2+ on the left side.  ?Pulmonary:  ?   Effort: Pulmonary effort is normal.  ?   Breath  sounds: Normal breath sounds.  ?Feet:  ?   Right foot:  ?   Protective Sensation: 10 sites tested.   1 site sensed. ?   Toenail Condition: Right toenails are abnormally thick. Fungal disease present. ?   Left foot:  ?   Protective Sensation: 10 sites tested.   1 site sensed. ?   Toenail Condition: Left toenails are abnormally thick. Fungal disease present. ?Skin: ?   Comments: See photos of left lower extremity   ?Neurological:  ?   Mental Status: He is alert.  ? ? ? ? ? ? ?   ?Assessment & Plan:  ? ?Problem List Items Addressed This Visit   ? ?  ? Endocrine  ? Type 2 diabetes mellitus with diabetic neuropathy, with long-term current use of insulin (HCC) - Primary  ? ?Other Visit Diagnoses   ? ? Rash and nonspecific skin eruption      ? ?  ? ? ? ?Meds ordered this encounter  ?Medications  ? clotrimazole-betamethasone (LOTRISONE) cream  ?  Sig: Apply 1 application. topically daily.  ?  Dispense:  45 g  ?  Refill:  0  ? gabapentin (NEURONTIN) 300 MG capsule  ?  Sig: Take 1 capsule (300 mg total) by mouth 3 (three) times daily.  ?  Dispense:  90 capsule  ?  Refill:  3  ? ? ?1. Type 2 diabetes mellitus with diabetic neuropathy, with long-term current use of insulin (HCC) ?Peripheral neuropathy becoming bothersome again. ?Start back on gabapentin 300 mg TID and slowly increase 100-300 mg weekly as needed. ?Cont to monitor sugars. ?He'll recheck labs with me next month ? ?2. Rash and nonspecific skin eruption ?Improving, ?dermatitis ?Trial lotrisone cream once daily for 2-3 weeks ?Derm referral if worse or no imp  ? ? ? ?Castulo Scarpelli M Taffany Heiser, PA-C ?

## 2022-01-09 NOTE — Patient Instructions (Addendum)
Start back on gabapentin - increase weekly by 100 - 300 mg as needed. ? ?Monitor blood sugars. ? ?Lotrisone cream as directed. ? ?Keep f/up as scheduled.  ?

## 2022-01-15 ENCOUNTER — Telehealth: Payer: Self-pay | Admitting: Physician Assistant

## 2022-01-15 ENCOUNTER — Other Ambulatory Visit: Payer: Self-pay

## 2022-01-15 MED ORDER — HYDROXYZINE HCL 50 MG PO TABS: 100.0000 mg | ORAL_TABLET | Freq: Every evening | ORAL | 0 refills | Status: DC | PRN

## 2022-01-15 NOTE — Telephone Encounter (Signed)
Rx sent 

## 2022-01-15 NOTE — Telephone Encounter (Signed)
.. ?  Encourage patient to contact the pharmacy for refills or they can request refills through [facility] ? ?LAST APPOINTMENT DATE:  01/09/22 ? ?NEXT APPOINTMENT DATE: 02/10/22 ? ?MEDICATION:hydrOXYzine (ATARAX) 50 MG tablet ? ?Is the patient out of medication?  ? ?PHARMACY: ?[facility] [address] [place] - [address] Phone:  [phone]  ?Fax:  [phone]  ?  ? ? ?Let patient know to contact pharmacy at the end of the day to make sure medication is ready. ? ?Please notify patient to allow 48-72 hours to process  ?

## 2022-01-23 ENCOUNTER — Other Ambulatory Visit: Payer: Self-pay

## 2022-01-23 MED ORDER — INSULIN ASPART PROT & ASPART (70-30 MIX) 100 UNIT/ML ~~LOC~~ SUSP: 10.0000 [IU] | Freq: Two times a day (BID) | SUBCUTANEOUS | 11 refills | Status: DC

## 2022-01-29 DIAGNOSIS — N179 Acute kidney failure, unspecified: Secondary | ICD-10-CM | POA: Diagnosis present

## 2022-02-10 ENCOUNTER — Ambulatory Visit: Payer: Managed Care, Other (non HMO) | Admitting: Physician Assistant

## 2022-02-21 ENCOUNTER — Telehealth: Payer: Self-pay | Admitting: Physician Assistant

## 2022-02-21 ENCOUNTER — Other Ambulatory Visit: Payer: Self-pay

## 2022-02-21 MED ORDER — HYDROXYZINE HCL 25 MG PO TABS: 25.0000 mg | ORAL_TABLET | Freq: Three times a day (TID) | ORAL | 0 refills | Status: DC | PRN

## 2022-02-21 MED ORDER — HYDROXYZINE HCL 50 MG PO TABS: 100.0000 mg | ORAL_TABLET | Freq: Every evening | ORAL | 0 refills | Status: DC | PRN

## 2022-02-21 NOTE — Telephone Encounter (Signed)
Pt no showed last appointment. Please call to reschedule for anxiety f/u.   Thanks!

## 2022-02-21 NOTE — Telephone Encounter (Signed)
Vml for pt to call back and sch NO SHOWED apt from 5/8 with Alyssa-

## 2022-02-21 NOTE — Telephone Encounter (Signed)
2 prescriptions  .Marland Kitchen Encourage patient to contact the pharmacy for refills or they can request refills through [facility]  LAST APPOINTMENT DATE:  01/09/22   NEXT APPOINTMENT DATE: N/a  MEDICATION: hydrOXYzine (ATARAX) 50 MG tablet  hydrOXYzine (ATARAX) 25 MG tablet  Is the patient out of medication?  yes   PHARMACY: [facility] [address] [place] - [address]  [address] [place] [address]  Phone:  [phone]  Fax:  [phone]  Let patient know to contact pharmacy at the end of the day to make sure medication is ready.  Please notify patient to allow 48-72 hours to process

## 2022-03-04 ENCOUNTER — Ambulatory Visit (HOSPITAL_COMMUNITY): Payer: 59 | Admitting: Psychiatry

## 2022-04-01 ENCOUNTER — Telehealth: Payer: Self-pay | Admitting: Physician Assistant

## 2022-04-01 ENCOUNTER — Other Ambulatory Visit: Payer: Self-pay | Admitting: *Deleted

## 2022-04-01 MED ORDER — HYDROXYZINE HCL 50 MG PO TABS: 100.0000 mg | ORAL_TABLET | Freq: Every evening | ORAL | 0 refills | Status: DC | PRN

## 2022-04-01 MED ORDER — HYDROXYZINE HCL 25 MG PO TABS: 25.0000 mg | ORAL_TABLET | Freq: Three times a day (TID) | ORAL | 0 refills | Status: DC | PRN

## 2022-04-11 ENCOUNTER — Encounter: Payer: Self-pay | Admitting: Physician Assistant

## 2022-04-11 ENCOUNTER — Ambulatory Visit (INDEPENDENT_AMBULATORY_CARE_PROVIDER_SITE_OTHER): Payer: Commercial Managed Care - HMO | Admitting: Physician Assistant

## 2022-04-11 VITALS — BP 140/70 | HR 67 | Temp 98.3°F | Ht 72.0 in | Wt 212.2 lb

## 2022-04-11 DIAGNOSIS — E1165 Type 2 diabetes mellitus with hyperglycemia: Secondary | ICD-10-CM | POA: Diagnosis not present

## 2022-04-11 DIAGNOSIS — E785 Hyperlipidemia, unspecified: Secondary | ICD-10-CM | POA: Diagnosis not present

## 2022-04-11 DIAGNOSIS — Z794 Long term (current) use of insulin: Secondary | ICD-10-CM

## 2022-04-11 DIAGNOSIS — R5383 Other fatigue: Secondary | ICD-10-CM

## 2022-04-11 DIAGNOSIS — E1169 Type 2 diabetes mellitus with other specified complication: Secondary | ICD-10-CM

## 2022-04-11 DIAGNOSIS — F419 Anxiety disorder, unspecified: Secondary | ICD-10-CM

## 2022-04-11 DIAGNOSIS — F32A Depression, unspecified: Secondary | ICD-10-CM

## 2022-04-11 DIAGNOSIS — E1140 Type 2 diabetes mellitus with diabetic neuropathy, unspecified: Secondary | ICD-10-CM | POA: Diagnosis not present

## 2022-04-11 DIAGNOSIS — Z125 Encounter for screening for malignant neoplasm of prostate: Secondary | ICD-10-CM

## 2022-04-11 DIAGNOSIS — Z1211 Encounter for screening for malignant neoplasm of colon: Secondary | ICD-10-CM

## 2022-04-11 LAB — CBC WITH DIFFERENTIAL/PLATELET
Basophils Absolute: 0 10*3/uL (ref 0.0–0.1)
Basophils Relative: 0.5 % (ref 0.0–3.0)
Eosinophils Absolute: 0.3 10*3/uL (ref 0.0–0.7)
Eosinophils Relative: 5.9 % — ABNORMAL HIGH (ref 0.0–5.0)
HCT: 41.6 % (ref 39.0–52.0)
Hemoglobin: 14.3 g/dL (ref 13.0–17.0)
Lymphocytes Relative: 19.2 % (ref 12.0–46.0)
Lymphs Abs: 0.9 10*3/uL (ref 0.7–4.0)
MCHC: 34.3 g/dL (ref 30.0–36.0)
MCV: 84.4 fl (ref 78.0–100.0)
Monocytes Absolute: 0.5 10*3/uL (ref 0.1–1.0)
Monocytes Relative: 10.2 % (ref 3.0–12.0)
Neutro Abs: 3.1 10*3/uL (ref 1.4–7.7)
Neutrophils Relative %: 64.2 % (ref 43.0–77.0)
Platelets: 102 10*3/uL — ABNORMAL LOW (ref 150.0–400.0)
RBC: 4.93 Mil/uL (ref 4.22–5.81)
RDW: 16.8 % — ABNORMAL HIGH (ref 11.5–15.5)
WBC: 4.9 10*3/uL (ref 4.0–10.5)

## 2022-04-11 LAB — MICROALBUMIN / CREATININE URINE RATIO
Creatinine,U: 96.8 mg/dL
Microalb Creat Ratio: 2.4 mg/g (ref 0.0–30.0)
Microalb, Ur: 2.3 mg/dL — ABNORMAL HIGH (ref 0.0–1.9)

## 2022-04-11 LAB — COMPREHENSIVE METABOLIC PANEL
ALT: 22 U/L (ref 0–53)
AST: 31 U/L (ref 0–37)
Albumin: 4.2 g/dL (ref 3.5–5.2)
Alkaline Phosphatase: 144 U/L — ABNORMAL HIGH (ref 39–117)
BUN: 12 mg/dL (ref 6–23)
CO2: 25 mEq/L (ref 19–32)
Calcium: 9.2 mg/dL (ref 8.4–10.5)
Chloride: 107 mEq/L (ref 96–112)
Creatinine, Ser: 0.8 mg/dL (ref 0.40–1.50)
GFR: 94.3 mL/min (ref >60.00)
Glucose, Bld: 192 mg/dL — ABNORMAL HIGH (ref 70–99)
Potassium: 4.3 mEq/L (ref 3.5–5.1)
Sodium: 141 mEq/L (ref 135–145)
Total Bilirubin: 1.1 mg/dL (ref 0.2–1.2)
Total Protein: 6.8 g/dL (ref 6.0–8.3)

## 2022-04-11 LAB — PSA: PSA: 0.12 ng/mL (ref 0.10–4.00)

## 2022-04-11 LAB — TSH: TSH: 1.67 u[IU]/mL (ref 0.35–5.50)

## 2022-04-11 LAB — LIPID PANEL
Cholesterol: 151 mg/dL (ref 0–200)
HDL: 82.6 mg/dL (ref >39.00)
LDL Cholesterol: 59 mg/dL (ref 0–99)
NonHDL: 68.66
Total CHOL/HDL Ratio: 2
Triglycerides: 49 mg/dL (ref 0.0–149.0)
VLDL: 9.8 mg/dL (ref 0.0–40.0)

## 2022-04-11 LAB — VITAMIN B12: Vitamin B-12: 952 pg/mL — ABNORMAL HIGH (ref 211–911)

## 2022-04-11 LAB — HEMOGLOBIN A1C: Hgb A1c MFr Bld: 7.8 % — ABNORMAL HIGH (ref 4.6–6.5)

## 2022-04-11 LAB — VITAMIN D 25 HYDROXY (VIT D DEFICIENCY, FRACTURES): VITD: 38.79 ng/mL (ref 30.00–100.00)

## 2022-04-11 NOTE — Patient Instructions (Addendum)
Good to see you today!  Fasting labs, work on diabetic plan from there.  Increase Gabapentin 300 mg three times daily, to now one AM, one midday, two in evening;  After one week, may increase to two tabs in AM, one midday, two in evening; After two weeks if still not having good control of neuropathy, increase to two tabs AM, two midday, two in evening.  Cologuard ordered, will be mailed to your house.   Referral to podiatrist

## 2022-04-11 NOTE — Progress Notes (Signed)
Subjective:    Patient ID: Eric Ortega, male    DOB: 1960/10/11, 61 y.o.   MRN: [id number]  Chief Complaint  Patient presents with   Follow-up    Pt coming in for f/u; pt wants to discuss neuropathy in hands and feet and feels it is getting worse; pt has had recent dental work and not able to really eat like normal, feeling a little sluggish, unable to wear dentures, not healing well;     HPI Patient is in today for f/up of chronic conditions. Needing labs updated.   T2DM/ neuropathy: Neuropathy in feet / arms / hands, tingling and numb feeling. No weakness. Taking Gabapentin 300 mg TID. Helped when started this medication again, but feeling bad sx's now.  Sugars 170s-210 average.  Taking Metformin 500 mg once or twice daily.  Insulin 10 units twice daily - started when in hospital last year.   Hasn't been eating much due to recent dental work, could be contributing to sluggish feeling.   Past Medical History:  Diagnosis Date   Diabetes mellitus without complication (HCC)    Hyperlipidemia    Hypertension     Past Surgical History:  Procedure Laterality Date   APPENDECTOMY      Family History  Problem Relation Age of Onset   Diabetes Mother    Heart failure Mother    Heart failure Father     Social History   Tobacco Use   Smoking status: Former   Smokeless tobacco: Never  Building services engineer Use: Never used  Substance Use Topics   Alcohol use: No   Drug use: No     No Known Allergies  Review of Systems NEGATIVE UNLESS OTHERWISE INDICATED IN HPI      Objective:     BP 140/70 (BP Location: Right Arm)   Pulse 67   Temp 98.3 F (36.8 C) (Temporal)   Ht 6' (1.829 m)   Wt 212 lb 3.2 oz (96.3 kg)   SpO2 100%   BMI 28.78 kg/m   Wt Readings from Last 3 Encounters:  04/11/22 212 lb 3.2 oz (96.3 kg)  12/10/21 203 lb 6.1 oz (92.3 kg)  11/26/21 203 lb (92.1 kg)    BP Readings from Last 3 Encounters:  04/11/22 140/70  01/09/22 133/80  12/10/21  132/76     Physical Exam Constitutional:      Appearance: Normal appearance. He is normal weight.  HENT:     Head: Normocephalic.  Eyes:     Extraocular Movements: Extraocular movements intact.     Pupils: Pupils are equal, round, and reactive to light.  Cardiovascular:     Rate and Rhythm: Normal rate and regular rhythm.     Pulses:          Dorsalis pedis pulses are 2+ on the right side and 2+ on the left side.       Posterior tibial pulses are 2+ on the right side and 2+ on the left side.  Pulmonary:     Effort: Pulmonary effort is normal.     Breath sounds: Normal breath sounds.  Feet:     Right foot:     Protective Sensation: 10 sites tested.   1 site sensed.    Toenail Condition: Right toenails are abnormally thick. Fungal disease present.    Left foot:     Protective Sensation: 10 sites tested.   1 site sensed.    Toenail Condition: Left toenails are abnormally thick. Fungal disease  present. Skin:    Findings: No rash.  Neurological:     Mental Status: He is alert.     Motor: No weakness.     Gait: Gait normal.     Comments: Monofilament decreased bilateral feet / ankles  Psychiatric:        Mood and Affect: Mood normal.        Behavior: Behavior normal.        Assessment & Plan:   Problem List Items Addressed This Visit       Endocrine   Hyperglycemia due to diabetes mellitus (HCC)   Relevant Orders   Comprehensive metabolic panel (Completed)   Microalbumin / creatinine urine ratio (Completed)   Ambulatory referral to Podiatry   Type 2 diabetes mellitus with diabetic neuropathy, with long-term current use of insulin (HCC) - Primary   Relevant Orders   CBC with Differential/Platelet (Completed)   Comprehensive metabolic panel (Completed)   Lipid panel (Completed)   Hemoglobin A1c (Completed)   Microalbumin / creatinine urine ratio (Completed)   Ambulatory referral to Podiatry   Other Visit Diagnoses     Hyperlipidemia associated with type 2  diabetes mellitus (HCC)       Relevant Orders   Lipid panel (Completed)   Anxiety and depression       Relevant Orders   CBC with Differential/Platelet (Completed)   Comprehensive metabolic panel (Completed)   Other fatigue       Relevant Orders   CBC with Differential/Platelet (Completed)   Comprehensive metabolic panel (Completed)   TSH (Completed)   Vitamin B12 (Completed)   Vitamin D (25 hydroxy) (Completed)   Prostate cancer screening       Relevant Orders   PSA (Completed)   Screening for colon cancer       Relevant Orders   Cologuard      Plan: Fasting labs, work on diabetic plan from there.  Increase Gabapentin 300 mg three times daily, to now one AM, one midday, two in evening;  After one week, may increase to two tabs in AM, one midday, two in evening; After two weeks if still not having good control of neuropathy, increase to two tabs AM, two midday, two in evening.  Cologuard ordered, will be mailed to your house.   Referral to podiatrist  Doing well with mental health   Return in about 3 months (around 07/12/2022) for recheck .   Ranata Laughery M Laraya Pestka, PA-C

## 2022-05-02 ENCOUNTER — Ambulatory Visit: Payer: Commercial Managed Care - HMO | Admitting: Podiatrist

## 2022-05-05 ENCOUNTER — Other Ambulatory Visit: Payer: Self-pay

## 2022-05-05 ENCOUNTER — Telehealth: Payer: Self-pay | Admitting: Physician Assistant

## 2022-05-05 ENCOUNTER — Other Ambulatory Visit: Payer: Self-pay | Admitting: Physician Assistant

## 2022-05-05 ENCOUNTER — Telehealth: Payer: Self-pay

## 2022-05-05 MED ORDER — METFORMIN HCL 500 MG PO TABS: 500.0000 mg | ORAL_TABLET | Freq: Two times a day (BID) | ORAL | 0 refills | Status: DC

## 2022-05-05 MED ORDER — GABAPENTIN 300 MG PO CAPS: ORAL_CAPSULE | ORAL | 2 refills | Status: DC

## 2022-05-05 MED ORDER — GABAPENTIN 300 MG PO CAPS: 300.0000 mg | ORAL_CAPSULE | Freq: Three times a day (TID) | ORAL | 3 refills | Status: DC

## 2022-05-05 NOTE — Telephone Encounter (Signed)
..   Encourage patient to contact the pharmacy for refills or they can request refills through [facility]  LAST APPOINTMENT DATE:  Please schedule appointment if longer than 1 year  04/11/22  NEXT APPOINTMENT DATE:  MEDICATION:gabapentin (NEURONTIN) 300 MG capsule  AND   metFORMIN (GLUCOPHAGE) 500 MG tablet (Expired)  Is the patient out of medication? Yes  PHARMACY: [facility] [address], [place] - [address] Phone:  [phone]  Fax:  [phone]      Let patient know to contact pharmacy at the end of the day to make sure medication is ready.  Please notify patient to allow 48-72 hours to process

## 2022-05-05 NOTE — Telephone Encounter (Signed)
According to last OV note, ok to up does to 4 a day and needs new Rx reflecting change. Please advise

## 2022-05-05 NOTE — Telephone Encounter (Signed)
Patient states Pharmacy would not fill RX for gabapentin (NEURONTIN) 300 MG capsule-Pharmacy told Patient it was too early. Patient states the dosage was supposed to have been to Patient takes 4 per day (instead of 3 per day. Patient states this was discussed at last visit.  Patient requests new RX for the above medication with increased dosage of 4 capsules per day be sent to:   [facility] [address], [place] - [address] Phone:  [phone]  Fax:  [phone]    Patient states he is completely out of the medication due to Patient has been taking 4 capsules per day.

## 2022-05-05 NOTE — Telephone Encounter (Signed)
Rx sent to pharmacy   

## 2022-05-19 ENCOUNTER — Telehealth: Payer: Self-pay | Admitting: Physician Assistant

## 2022-05-19 NOTE — Telephone Encounter (Signed)
2 prescriptions   LAST APPOINTMENT DATE:  04/11/22 OV with PCP   NEXT APPOINTMENT DATE: N/A   MEDICATION: hydrOXYzine (ATARAX) 25 MG tablet [[id number]]  hydrOXYzine (ATARAX) 50 MG tablet [[id number]]    Is the patient out of medication?  Yes, ran out on 08/11   PHARMACY: [facility] [address], [place] - [address]  [address] [place] [address]  Phone:  [phone]  Fax:  [phone]

## 2022-05-20 ENCOUNTER — Other Ambulatory Visit: Payer: Self-pay

## 2022-05-20 MED ORDER — HYDROXYZINE HCL 50 MG PO TABS: 100.0000 mg | ORAL_TABLET | Freq: Every evening | ORAL | 0 refills | Status: DC | PRN

## 2022-05-20 MED ORDER — HYDROXYZINE HCL 25 MG PO TABS: 25.0000 mg | ORAL_TABLET | Freq: Three times a day (TID) | ORAL | 0 refills | Status: DC | PRN

## 2022-05-20 NOTE — Progress Notes (Signed)
Rx sent to pharmacy   

## 2022-05-20 NOTE — Telephone Encounter (Signed)
Rx sent to pharmacy   

## 2022-06-23 ENCOUNTER — Telehealth: Payer: Self-pay | Admitting: Physician Assistant

## 2022-06-23 ENCOUNTER — Other Ambulatory Visit: Payer: Self-pay

## 2022-06-23 MED ORDER — ATENOLOL 25 MG PO TABS: 25.0000 mg | ORAL_TABLET | Freq: Every day | ORAL | 1 refills | Status: DC

## 2022-06-23 NOTE — Telephone Encounter (Signed)
Rx sent to pharmacy   

## 2022-06-23 NOTE — Telephone Encounter (Signed)
..   Encourage patient to contact the pharmacy for refills or they can request refills through [facility]:  04/11/22  NEXT APPOINTMENT DATE: na  MEDICATION: atenolol  Is the patient out of medication?   [facility] [address]   Let patient know to contact pharmacy at the end of the day to make sure medication is ready.  Please notify patient to allow 48-72 hours to process  CLINICAL FILLS OUT ALL BELOW:   LAST REFILL:  QTY:  REFILL DATE:    OTHER COMMENTS:    Okay for refill?  Please advise

## 2022-06-30 ENCOUNTER — Encounter: Payer: Self-pay | Admitting: *Deleted

## 2022-07-14 ENCOUNTER — Other Ambulatory Visit: Payer: Self-pay

## 2022-07-14 ENCOUNTER — Telehealth: Payer: Self-pay | Admitting: Physician Assistant

## 2022-07-14 MED ORDER — HYDROXYZINE HCL 25 MG PO TABS: 25.0000 mg | ORAL_TABLET | Freq: Three times a day (TID) | ORAL | 1 refills | Status: DC | PRN

## 2022-07-14 MED ORDER — ATORVASTATIN CALCIUM 40 MG PO TABS: 40.0000 mg | ORAL_TABLET | Freq: Every day | ORAL | 1 refills | Status: DC

## 2022-07-14 MED ORDER — HYDROXYZINE HCL 50 MG PO TABS: 100.0000 mg | ORAL_TABLET | Freq: Every evening | ORAL | 1 refills | Status: DC | PRN

## 2022-07-14 NOTE — Telephone Encounter (Unsigned)
   LAST APPOINTMENT DATE:  04/11/22  NEXT APPOINTMENT DATE: None  MEDICATION:  hydrOXYzine (ATARAX) 25 MG tablet  hydrOXYzine (ATARAX) 50 MG tablet  atorvastatin (LIPITOR) 40 MG tablet   Is the patient out of medication? Yes  PHARMACY: [facility] [address], [place] - [address] [place] [address] Phone: [phone]  Fax: [phone]

## 2022-07-14 NOTE — Telephone Encounter (Signed)
Rx sent to pharmacy   

## 2022-08-08 ENCOUNTER — Telehealth: Payer: Self-pay | Admitting: Physician Assistant

## 2022-08-08 ENCOUNTER — Other Ambulatory Visit: Payer: Self-pay

## 2022-08-08 MED ORDER — GABAPENTIN 300 MG PO CAPS: ORAL_CAPSULE | ORAL | 2 refills | Status: DC

## 2022-08-08 MED ORDER — METFORMIN HCL 500 MG PO TABS: 500.0000 mg | ORAL_TABLET | Freq: Two times a day (BID) | ORAL | 0 refills | Status: DC

## 2022-08-08 NOTE — Telephone Encounter (Signed)
LAST APPOINTMENT DATE:  Please schedule appointment if longer than 1 year  06/18/22  NEXT APPOINTMENT DATE:  08/13/22  MEDICATION:  gabapentin (NEURONTIN) 300 MG capsule  AND  metFORMIN (GLUCOPHAGE) 500 MG tablet (Expired)   Is the patient out of medication? Yes  PHARMACY:  [facility] [address], [place] - [address] Phone: [phone]  Fax: [phone]      Let patient know to contact pharmacy at the end of the day to make sure medication is ready.  Please notify patient to allow 48-72 hours to process

## 2022-08-08 NOTE — Telephone Encounter (Signed)
Rx sent to pharmacy   

## 2022-08-13 ENCOUNTER — Ambulatory Visit: Payer: Self-pay | Admitting: Physician Assistant

## 2022-09-18 ENCOUNTER — Ambulatory Visit (INDEPENDENT_AMBULATORY_CARE_PROVIDER_SITE_OTHER): Payer: Commercial Managed Care - HMO | Admitting: Physician Assistant

## 2022-09-18 ENCOUNTER — Encounter: Payer: Self-pay | Admitting: Physician Assistant

## 2022-09-18 ENCOUNTER — Encounter: Payer: Self-pay | Admitting: *Deleted

## 2022-09-18 VITALS — BP 130/68 | HR 63 | Temp 98.6°F | Resp 16 | Wt 223.6 lb

## 2022-09-18 DIAGNOSIS — Z794 Long term (current) use of insulin: Secondary | ICD-10-CM

## 2022-09-18 DIAGNOSIS — E1140 Type 2 diabetes mellitus with diabetic neuropathy, unspecified: Secondary | ICD-10-CM | POA: Diagnosis not present

## 2022-09-18 DIAGNOSIS — G4701 Insomnia due to medical condition: Secondary | ICD-10-CM | POA: Diagnosis not present

## 2022-09-18 DIAGNOSIS — F32A Depression, unspecified: Secondary | ICD-10-CM

## 2022-09-18 DIAGNOSIS — F419 Anxiety disorder, unspecified: Secondary | ICD-10-CM | POA: Diagnosis not present

## 2022-09-18 LAB — POCT GLYCOSYLATED HEMOGLOBIN (HGB A1C): HbA1c POC (<> result, manual entry): 8.3 % (ref 4.0–5.6)

## 2022-09-18 MED ORDER — METFORMIN HCL 500 MG PO TABS: 1000.0000 mg | ORAL_TABLET | Freq: Two times a day (BID) | ORAL | 0 refills | Status: DC

## 2022-09-18 MED ORDER — AMITRIPTYLINE HCL 25 MG PO TABS: 25.0000 mg | ORAL_TABLET | Freq: Every day | ORAL | 0 refills | Status: DC

## 2022-09-18 MED ORDER — HYDROXYZINE HCL 50 MG PO TABS: 100.0000 mg | ORAL_TABLET | Freq: Every evening | ORAL | 1 refills | Status: DC | PRN

## 2022-09-18 MED ORDER — HYDROXYZINE HCL 25 MG PO TABS
25.0000 mg | ORAL_TABLET | Freq: Three times a day (TID) | ORAL | 1 refills | Status: DC | PRN
Start: 2022-09-18 — End: 2023-05-20

## 2022-09-18 NOTE — Assessment & Plan Note (Signed)
Plan to start on amitriptyline 25 mg at bedtime.  We can slowly titrate this medication.  Hopefully this will help with sleep, anxiety, nerve pain.  He has done well on this medication previously.

## 2022-09-18 NOTE — Assessment & Plan Note (Signed)
Lab Results  Component Value Date   HGBA1C 8.3 09/18/2022   Currently uncontrolled. Plan to increase metformin to 1000 mg twice daily. Continue insulin 10 units twice daily. Work on lifestyle changes. Try to monitor glucose at home.

## 2022-09-18 NOTE — Assessment & Plan Note (Signed)
Worse secondary to lack of sleep and neuropathic pain. Plan to start on amitriptyline 25 mg at bedtime. Patient has hydroxyzine 50 to 100 mg at bedtime as needed. He also has a 25 mg tablet of hydroxyzine that he can take during the day as needed for panic. Consider consult with psych if worse or no improvement.

## 2022-09-18 NOTE — Progress Notes (Signed)
Subjective:    Patient ID: Eric Ortega, male    DOB: [birth year], 61 y.o.   MRN: [id number]  Chief Complaint  Patient presents with   Peripheral Neuropathy    Bilateral feet and hands Has seen neuropathy specialist in the past but was not covered by insurance - treatment OOP was $9k    Insomnia    3 hours per night x 3 weeks Not sure if mood swings or depression are causing problem Was previously on seroquel but made overall mood worse and did not help with sleep    Insomnia   Patient is in today for discussion about neuropathy and insomnia.   Insomnia: Stopped seroquel because it made him feel like he was "going crazy" Tried hydroxyzine to help settle down anxiety.  Mood swings a much worse right now Has a hard time relaxing during the day and worse at night. Only slept about 3 hours last night   Neuropathy: Worse, sensation about 80% gone in both feet; about 30-40% gone in hands Taking gabapentin 300 mg - 1 am, 1 lunch, 2 pm; & Nervive (OTC)  Diabetes: Metformin 500 mg 1 tab twice daily  Insulin 10 units twice daily  Hasn't been eating well.  Past Medical History:  Diagnosis Date   Diabetes mellitus without complication (HCC)    Hyperlipidemia    Hypertension     Past Surgical History:  Procedure Laterality Date   APPENDECTOMY      Family History  Problem Relation Age of Onset   Diabetes Mother    Heart failure Mother    Heart failure Father     Social History   Tobacco Use   Smoking status: Former   Smokeless tobacco: Never  Building services engineer Use: Never used  Substance Use Topics   Alcohol use: No   Drug use: No     No Known Allergies  Review of Systems  Psychiatric/Behavioral:  The patient has insomnia.    NEGATIVE UNLESS OTHERWISE INDICATED IN HPI      Objective:     BP 130/68 (BP Location: Left Arm)   Pulse 63   Temp 98.6 F (37 C) (Oral)   Resp 16   Wt 223 lb 9.6 oz (101.4 kg)   SpO2 100%   BMI 30.33 kg/m   Wt Readings  from Last 3 Encounters:  09/18/22 223 lb 9.6 oz (101.4 kg)  04/11/22 212 lb 3.2 oz (96.3 kg)  12/10/21 203 lb 6.1 oz (92.3 kg)    BP Readings from Last 3 Encounters:  09/18/22 130/68  04/11/22 140/70  01/09/22 133/80     Physical Exam Vitals and nursing note reviewed.  Constitutional:      Appearance: Normal appearance. He is obese. He is not ill-appearing.  Eyes:     Extraocular Movements: Extraocular movements intact.     Conjunctiva/sclera: Conjunctivae normal.     Pupils: Pupils are equal, round, and reactive to light.  Cardiovascular:     Rate and Rhythm: Normal rate and regular rhythm.  Pulmonary:     Effort: Pulmonary effort is normal.     Breath sounds: Normal breath sounds.  Neurological:     Mental Status: He is alert and oriented to person, place, and time.  Psychiatric:        Mood and Affect: Mood normal.        Behavior: Behavior normal.        Assessment & Plan:  Type 2 diabetes mellitus with diabetic neuropathy,  with long-term current use of insulin [facility]) Assessment & Plan: Lab Results  Component Value Date   HGBA1C 8.3 09/18/2022   Currently uncontrolled. Plan to increase metformin to 1000 mg twice daily. Continue insulin 10 units twice daily. Work on lifestyle changes. Try to monitor glucose at home.  Orders: -     POCT glycosylated hemoglobin (Hb A1C)  Insomnia due to medical condition Assessment & Plan: Plan to start on amitriptyline 25 mg at bedtime.  We can slowly titrate this medication.  Hopefully this will help with sleep, anxiety, nerve pain.  He has done well on this medication previously.   Anxiety and depression Assessment & Plan: Worse secondary to lack of sleep and neuropathic pain. Plan to start on amitriptyline 25 mg at bedtime. Patient has hydroxyzine 50 to 100 mg at bedtime as needed. He also has a 25 mg tablet of hydroxyzine that he can take during the day as needed for panic. Consider consult with psych if worse or no  improvement.   Other orders -     Amitriptyline HCl; Take 1 tablet (25 mg total) by mouth at bedtime.  Dispense: 30 tablet; Refill: 0 -     hydrOXYzine HCl; Take 2 tablets (100 mg total) by mouth at bedtime as needed.  Dispense: 180 tablet; Refill: 1 -     hydrOXYzine HCl; Take 1 tablet (25 mg total) by mouth 3 (three) times daily as needed for anxiety.  Dispense: 90 tablet; Refill: 1 -     metFORMIN HCl; Take 2 tablets (1,000 mg total) by mouth 2 (two) times daily with a meal.  Dispense: 360 tablet; Refill: 0        Return in about 3 months (around 12/18/2022) for recheck, fasting labs .  This note was prepared with assistance of Conservation officer, historic buildings. Occasional wrong-word or sound-a-like substitutions may have occurred due to the inherent limitations of voice recognition software.    Larenz Frasier M Burnadette Baskett, PA-C

## 2022-09-18 NOTE — Patient Instructions (Addendum)
Start on amitriptyline 25 mg at bedtime Increase Metformin to two tablets twice daily  Work on dietary / lifestyle changes

## 2022-10-14 ENCOUNTER — Telehealth: Payer: Self-pay | Admitting: Physician Assistant

## 2022-10-14 NOTE — Telephone Encounter (Signed)
  LAST APPOINTMENT DATE:  09/18/22  NEXT APPOINTMENT DATE: 12/18/22  MEDICATION: atenolol (TENORMIN) 25 MG tablet  amitriptyline (ELAVIL) 25 MG tablet   Is the patient out of medication? Yes  PHARMACY:  [facility] [address] [place] - [address] [place] [address] Phone: [phone]  Fax: [phone]    Patient states that PCP stated if he was doing well with the amitriptyline that she would increase the dosage. He would like to do this.

## 2022-10-15 ENCOUNTER — Other Ambulatory Visit: Payer: Self-pay

## 2022-10-15 MED ORDER — AMITRIPTYLINE HCL 25 MG PO TABS: 25.0000 mg | ORAL_TABLET | Freq: Every day | ORAL | 2 refills | Status: DC

## 2022-10-15 MED ORDER — ATENOLOL 25 MG PO TABS: 25.0000 mg | ORAL_TABLET | Freq: Every day | ORAL | 1 refills | Status: DC

## 2022-10-15 NOTE — Telephone Encounter (Signed)
Rx sent to patient pharmacy as requested.  

## 2022-12-11 ENCOUNTER — Other Ambulatory Visit: Payer: Self-pay | Admitting: Physician Assistant

## 2022-12-11 NOTE — Telephone Encounter (Signed)
Last OV: 09/18/22  Next OV: 12/18/22

## 2022-12-18 ENCOUNTER — Ambulatory Visit: Payer: Self-pay | Admitting: Physician Assistant

## 2023-01-06 ENCOUNTER — Other Ambulatory Visit: Payer: Self-pay | Admitting: Physician Assistant

## 2023-01-28 ENCOUNTER — Telehealth: Payer: Self-pay | Admitting: Physician Assistant

## 2023-01-28 ENCOUNTER — Other Ambulatory Visit: Payer: Self-pay

## 2023-01-28 MED ORDER — AMITRIPTYLINE HCL 25 MG PO TABS: 25.0000 mg | ORAL_TABLET | Freq: Every day | ORAL | 0 refills | Status: DC

## 2023-01-28 NOTE — Telephone Encounter (Signed)
Rx sent to pharmacy to have on file for next week per pt request

## 2023-01-28 NOTE — Telephone Encounter (Signed)
Prescription Request  01/28/2023  LOV: 09/18/2022  What is the name of the medication or equipment?  amitriptyline (ELAVIL) 25 MG tablet  Patient states dosage was increased at last OV to take 2 tablets per day  Have you contacted your pharmacy to request a refill? Yes   Which pharmacy would you like this sent to?  [facility] [address] [place] - [address] [place] [address] Phone: [phone] Fax: [phone]    Patient notified that their request is being sent to the clinical staff for review and that they should receive a response within 2 business days.   Please advise at Mobile [phone] (mobile)

## 2023-02-02 ENCOUNTER — Ambulatory Visit: Payer: Self-pay | Admitting: Physician Assistant

## 2023-03-11 ENCOUNTER — Encounter: Payer: Self-pay | Admitting: Physician Assistant

## 2023-03-11 ENCOUNTER — Ambulatory Visit (INDEPENDENT_AMBULATORY_CARE_PROVIDER_SITE_OTHER): Payer: Self-pay | Admitting: Physician Assistant

## 2023-03-11 VITALS — BP 134/78 | HR 82 | Temp 98.0°F | Ht 72.0 in | Wt 209.6 lb

## 2023-03-11 DIAGNOSIS — Z1211 Encounter for screening for malignant neoplasm of colon: Secondary | ICD-10-CM

## 2023-03-11 DIAGNOSIS — R1013 Epigastric pain: Secondary | ICD-10-CM

## 2023-03-11 DIAGNOSIS — E1140 Type 2 diabetes mellitus with diabetic neuropathy, unspecified: Secondary | ICD-10-CM

## 2023-03-11 DIAGNOSIS — G4701 Insomnia due to medical condition: Secondary | ICD-10-CM

## 2023-03-11 DIAGNOSIS — Z794 Long term (current) use of insulin: Secondary | ICD-10-CM

## 2023-03-11 DIAGNOSIS — R112 Nausea with vomiting, unspecified: Secondary | ICD-10-CM

## 2023-03-11 LAB — COMPREHENSIVE METABOLIC PANEL
ALT: 24 U/L (ref 0–53)
AST: 34 U/L (ref 0–37)
Albumin: 3.9 g/dL (ref 3.5–5.2)
Alkaline Phosphatase: 118 U/L — ABNORMAL HIGH (ref 39–117)
BUN: 12 mg/dL (ref 6–23)
CO2: 22 mEq/L (ref 19–32)
Calcium: 8.8 mg/dL (ref 8.4–10.5)
Chloride: 103 mEq/L (ref 96–112)
Creatinine, Ser: 0.79 mg/dL (ref 0.40–1.50)
GFR: 94.06 mL/min (ref >60.00)
Glucose, Bld: 243 mg/dL — ABNORMAL HIGH (ref 70–99)
Potassium: 4.4 mEq/L (ref 3.5–5.1)
Sodium: 137 mEq/L (ref 135–145)
Total Bilirubin: 1.3 mg/dL — ABNORMAL HIGH (ref 0.2–1.2)
Total Protein: 6.8 g/dL (ref 6.0–8.3)

## 2023-03-11 LAB — CBC WITH DIFFERENTIAL/PLATELET
Basophils Absolute: 0 10*3/uL (ref 0.0–0.1)
Basophils Relative: 0.5 % (ref 0.0–3.0)
Eosinophils Absolute: 0.2 10*3/uL (ref 0.0–0.7)
Eosinophils Relative: 5.6 % — ABNORMAL HIGH (ref 0.0–5.0)
HCT: 41.1 % (ref 39.0–52.0)
Hemoglobin: 14.1 g/dL (ref 13.0–17.0)
Lymphocytes Relative: 26.3 % (ref 12.0–46.0)
Lymphs Abs: 1.1 10*3/uL (ref 0.7–4.0)
MCHC: 34.4 g/dL (ref 30.0–36.0)
MCV: 85.6 fl (ref 78.0–100.0)
Monocytes Absolute: 0.7 10*3/uL (ref 0.1–1.0)
Monocytes Relative: 16.6 % — ABNORMAL HIGH (ref 3.0–12.0)
Neutro Abs: 2.1 10*3/uL (ref 1.4–7.7)
Neutrophils Relative %: 51 % (ref 43.0–77.0)
Platelets: 88 10*3/uL — ABNORMAL LOW (ref 150.0–400.0)
RBC: 4.8 Mil/uL (ref 4.22–5.81)
RDW: 15.1 % (ref 11.5–15.5)
WBC: 4.2 10*3/uL (ref 4.0–10.5)

## 2023-03-11 LAB — LIPASE: Lipase: 26 U/L (ref 11.0–59.0)

## 2023-03-11 LAB — HEMOGLOBIN A1C: Hgb A1c MFr Bld: 9 % — ABNORMAL HIGH (ref 4.6–6.5)

## 2023-03-11 MED ORDER — FREESTYLE LIBRE 14 DAY READER DEVI
1.0000 | 11 refills | Status: AC
Start: 2023-03-11 — End: ?

## 2023-03-11 MED ORDER — ATORVASTATIN CALCIUM 40 MG PO TABS: 40.0000 mg | ORAL_TABLET | Freq: Every day | ORAL | 3 refills | Status: DC

## 2023-03-11 MED ORDER — OZEMPIC (0.25 OR 0.5 MG/DOSE) 2 MG/3ML ~~LOC~~ SOPN
PEN_INJECTOR | SUBCUTANEOUS | 0 refills | Status: AC
Start: 2023-03-11 — End: 2023-05-05

## 2023-03-11 MED ORDER — FREESTYLE LIBRE 14 DAY SENSOR MISC
11 refills | Status: AC
Start: 2023-03-11 — End: ?

## 2023-03-11 NOTE — Assessment & Plan Note (Signed)
Lab Results  Component Value Date   HGBA1C 8.3 09/18/2022   HGBA1C 7.8 (H) 04/11/2022   HGBA1C 7.7 (H) 11/26/2021   Update labs today. Drop metformin to 500 mg BID due to N/V symptoms. Insulin 14 units BID. Would like to drops off insulin and do Ozempic in place of this, will need to slowly wean / titrate, monitor glucose carefully. Freestyle ordered.

## 2023-03-11 NOTE — Patient Instructions (Signed)
Labs today - will call / MyChart with results.  I wonder if Metformin is causing issues at night. Let's drop back to 500 mg twice daily and see if this helps.  Let me know if Freestyle device is covered for you. This will be good to help monitor glucose levels.   I would like to get you off of insulin and onto Ozempic instead, as long as you can tolerate it well. Based on labs, I'll send you specific instructions on this.  Keep on amitriptyline. I'm glad you're sleeping better.

## 2023-03-11 NOTE — Assessment & Plan Note (Signed)
Better, controlled, amitriptyline 25 mg at bedtime.

## 2023-03-11 NOTE — Progress Notes (Signed)
Subjective:    Patient ID: Eric Ortega, male    DOB: [birth year], 62 y.o.   MRN: [id number]  Chief Complaint  Patient presents with   Medical Management of Chronic Issues    Pt in office for 3 mon f/u that has been rescheduled and needing fasting labs; pt states having GI issues, waking up in the night vomiting and sick for a few days after each occurrence.     HPI Patient is in today for follow-up chronic concerns. See A/P for details.   Since last visit has been having episodes of vomiting in the middle of the night, feels sick for a few days following. Denies any diarrhea or blood in stool. Occasionally cramping in abdomen. We also increase Metformin since last visit.   14 units twice daily of insulin. Glucose this morning was 105.   Appetite has been down lately. Working in Armed forces technical officer now.  Amitriptyline has helped the neuropathy, as well as gabapentin. Sleep has improved since last visit as well.   Past Medical History:  Diagnosis Date   Diabetes mellitus without complication (HCC)    Hyperlipidemia    Hypertension     Past Surgical History:  Procedure Laterality Date   APPENDECTOMY      Family History  Problem Relation Age of Onset   Diabetes Mother    Heart failure Mother    Heart failure Father     Social History   Tobacco Use   Smoking status: Former   Smokeless tobacco: Never  Building services engineer Use: Never used  Substance Use Topics   Alcohol use: No   Drug use: No     No Known Allergies  Review of Systems NEGATIVE UNLESS OTHERWISE INDICATED IN HPI      Objective:     BP 134/78 (BP Location: Left Arm)   Pulse 82   Temp 98 F (36.7 C) (Temporal)   Ht 6' (1.829 m)   Wt 209 lb 9.6 oz (95.1 kg)   SpO2 99%   BMI 28.43 kg/m   Wt Readings from Last 3 Encounters:  03/11/23 209 lb 9.6 oz (95.1 kg)  09/18/22 223 lb 9.6 oz (101.4 kg)  04/11/22 212 lb 3.2 oz (96.3 kg)    BP Readings from Last 3 Encounters:  03/11/23 134/78  09/18/22  130/68  04/11/22 140/70     Physical Exam Vitals and nursing note reviewed.  Constitutional:      Appearance: Normal appearance.  Cardiovascular:     Rate and Rhythm: Normal rate and regular rhythm.     Pulses: Normal pulses.  Pulmonary:     Effort: Pulmonary effort is normal.     Breath sounds: Normal breath sounds.  Abdominal:     General: Abdomen is flat. Bowel sounds are normal.     Palpations: Abdomen is soft. There is no mass.     Tenderness: There is no abdominal tenderness.  Skin:    Findings: Lesion (numerous picked / scabs arms and legs) present.  Neurological:     Mental Status: He is alert.  Psychiatric:        Mood and Affect: Mood normal.        Assessment & Plan:  Type 2 diabetes mellitus with diabetic neuropathy, with long-term current use of insulin [facility]) Assessment & Plan: Lab Results  Component Value Date   HGBA1C 8.3 09/18/2022   HGBA1C 7.8 (H) 04/11/2022   HGBA1C 7.7 (H) 11/26/2021   Update labs today. Drop metformin to  500 mg BID due to N/V symptoms. Insulin 14 units BID. Would like to drops off insulin and do Ozempic in place of this, will need to slowly wean / titrate, monitor glucose carefully. Freestyle ordered.    Orders: -     Ozempic (0.25 or 0.5 MG/DOSE); Inject 0.25 mg into the skin once a week for 28 days, THEN 0.5 mg once a week for 28 days.  Dispense: 6 mL; Refill: 0 -     Hemoglobin A1c -     Comprehensive metabolic panel -     FreeStyle Libre 14 Day Sensor; APPLY EVERY 14 DAYS TO CHECK BLOOD SUGAR  Dispense: 1 each; Refill: 11 -     FreeStyle Libre 14 Day Reader; Apply 1 each topically continuous. Apply reader for monitoring of continuous glucose monitoring.  Dispense: 1 each; Refill: 11  Epigastric abdominal pain -     Lipase -     CBC with Differential/Platelet -     Comprehensive metabolic panel  Nausea and vomiting, unspecified vomiting type -     Lipase -     CBC with Differential/Platelet -     Comprehensive  metabolic panel  Screening for colon cancer -     Ambulatory referral to Gastroenterology  Insomnia due to medical condition Assessment & Plan: Better, controlled, amitriptyline 25 mg at bedtime.    Other orders -     Atorvastatin Calcium; Take 1 tablet (40 mg total) by mouth daily.  Dispense: 90 tablet; Refill: 3       Return in about 4 months (around 07/11/2023) for recheck/follow-up.    Abygail Galeno M Hollin Crewe, PA-C

## 2023-03-22 ENCOUNTER — Other Ambulatory Visit: Payer: Self-pay | Admitting: Physician Assistant

## 2023-03-23 ENCOUNTER — Encounter: Payer: Self-pay | Admitting: Physician Assistant

## 2023-03-23 NOTE — Progress Notes (Signed)
   Virtual Visit via Video Note  I connected with  Eric Ortega  on 03/23/23 at 11:00 AM EDT by a video enabled telemedicine application and verified that I am speaking with the correct person using two identifiers.  Location: Patient: home Provider: Nature conservation officer at [employer] Persons present: Patient and myself   CONNECTED WITH PATIENT TODAY - HE IS NOT SURE IF HE HAS INSURANCE COVERAGE AT THIS TIME OR NO & WORKING ON GETTING THIS FIXED. HE IS GOING TO CALL BACK AND RESCHEDULE.

## 2023-04-11 ENCOUNTER — Other Ambulatory Visit: Payer: Self-pay | Admitting: Physician Assistant

## 2023-05-06 ENCOUNTER — Other Ambulatory Visit: Payer: Self-pay | Admitting: Physician Assistant

## 2023-05-09 ENCOUNTER — Other Ambulatory Visit: Payer: Self-pay | Admitting: Physician Assistant

## 2023-05-20 ENCOUNTER — Other Ambulatory Visit: Payer: Self-pay

## 2023-05-20 ENCOUNTER — Telehealth: Payer: Self-pay | Admitting: Physician Assistant

## 2023-05-20 MED ORDER — GABAPENTIN 300 MG PO CAPS: ORAL_CAPSULE | ORAL | 0 refills | Status: DC

## 2023-05-20 MED ORDER — HYDROXYZINE HCL 50 MG PO TABS: 100.0000 mg | ORAL_TABLET | Freq: Every evening | ORAL | 1 refills | Status: DC | PRN

## 2023-05-20 MED ORDER — HYDROXYZINE HCL 25 MG PO TABS: 25.0000 mg | ORAL_TABLET | Freq: Three times a day (TID) | ORAL | 1 refills | Status: DC | PRN

## 2023-05-20 NOTE — Telephone Encounter (Signed)
Rx sent to pharmacy   

## 2023-05-20 NOTE — Telephone Encounter (Signed)
Prescription Request  05/20/2023  LOV: 03/11/2023  What is the name of the medication or equipment?   gabapentin (NEURONTIN) 300 MG capsule  hydrOXYzine (ATARAX) 25 MG tablet  hydrOXYzine (ATARAX) 50 MG tablet     Have you contacted your pharmacy to request a refill? Yes   Which pharmacy would you like this sent to?  [facility] [address] [place] - [address] [place] [address] Phone: [phone] Fax: [phone]    Patient notified that their request is being sent to the clinical staff for review and that they should receive a response within 2 business days.   Please advise at Mobile [phone] (mobile)

## 2023-06-11 ENCOUNTER — Other Ambulatory Visit: Payer: Self-pay | Admitting: Physician Assistant

## 2023-06-22 ENCOUNTER — Telehealth: Payer: Self-pay | Admitting: Physician Assistant

## 2023-06-22 NOTE — Telephone Encounter (Signed)
Prescription Request  06/22/2023  LOV: 03/11/2023  What is the name of the medication or equipment?  amitriptyline (ELAVIL) 25 MG tablet   gabapentin (NEURONTIN) 300 MG capsule    Have you contacted your pharmacy to request a refill? No   Which pharmacy would you like this sent to?  [facility] [address] [place] - [address] [place] [address] Phone: [phone] Fax: [phone]    Patient notified that their request is being sent to the clinical staff for review and that they should receive a response within 2 business days.   Please advise at Mobile [phone] (mobile)

## 2023-06-23 MED ORDER — AMITRIPTYLINE HCL 25 MG PO TABS: 25.0000 mg | ORAL_TABLET | Freq: Every day | ORAL | 0 refills | Status: DC

## 2023-06-23 MED ORDER — GABAPENTIN 300 MG PO CAPS: ORAL_CAPSULE | ORAL | 0 refills | Status: DC

## 2023-06-23 NOTE — Telephone Encounter (Signed)
Rx sent 

## 2023-07-05 ENCOUNTER — Other Ambulatory Visit: Payer: Self-pay | Admitting: Physician Assistant

## 2023-07-07 ENCOUNTER — Ambulatory Visit: Payer: 59 | Admitting: Physician Assistant

## 2023-07-07 ENCOUNTER — Encounter: Payer: Self-pay | Admitting: Physician Assistant

## 2023-07-07 VITALS — BP 134/74 | HR 69 | Temp 97.5°F | Ht 72.0 in | Wt 213.8 lb

## 2023-07-07 DIAGNOSIS — Z794 Long term (current) use of insulin: Secondary | ICD-10-CM | POA: Diagnosis not present

## 2023-07-07 DIAGNOSIS — E1140 Type 2 diabetes mellitus with diabetic neuropathy, unspecified: Secondary | ICD-10-CM

## 2023-07-07 DIAGNOSIS — F419 Anxiety disorder, unspecified: Secondary | ICD-10-CM | POA: Diagnosis not present

## 2023-07-07 DIAGNOSIS — F32A Depression, unspecified: Secondary | ICD-10-CM

## 2023-07-07 DIAGNOSIS — Z23 Encounter for immunization: Secondary | ICD-10-CM | POA: Diagnosis not present

## 2023-07-07 DIAGNOSIS — F424 Excoriation (skin-picking) disorder: Secondary | ICD-10-CM

## 2023-07-07 DIAGNOSIS — G4701 Insomnia due to medical condition: Secondary | ICD-10-CM | POA: Diagnosis not present

## 2023-07-07 DIAGNOSIS — Z1211 Encounter for screening for malignant neoplasm of colon: Secondary | ICD-10-CM

## 2023-07-07 LAB — POCT GLYCOSYLATED HEMOGLOBIN (HGB A1C): Hemoglobin A1C: 11.2 % — AB (ref 4.0–5.6)

## 2023-07-07 MED ORDER — DEXCOM G7 SENSOR MISC
11 refills | Status: DC
Start: 2023-07-07 — End: 2024-04-26

## 2023-07-07 MED ORDER — RAMELTEON 8 MG PO TABS
8.0000 mg | ORAL_TABLET | Freq: Every day | ORAL | 2 refills | Status: DC
Start: 2023-07-07 — End: 2023-10-26

## 2023-07-07 MED ORDER — GABAPENTIN 300 MG PO CAPS: ORAL_CAPSULE | ORAL | 2 refills | Status: DC

## 2023-07-07 MED ORDER — DEXCOM G7 RECEIVER DEVI: 1 refills | Status: DC

## 2023-07-07 MED ORDER — NOVOLOG 70/30 FLEXPEN RELION (70-30) 100 UNIT/ML ~~LOC~~ SUPN: PEN_INJECTOR | SUBCUTANEOUS | 5 refills | Status: DC

## 2023-07-07 MED ORDER — AMITRIPTYLINE HCL 50 MG PO TABS: 50.0000 mg | ORAL_TABLET | Freq: Every day | ORAL | 1 refills | Status: DC

## 2023-07-07 NOTE — Addendum Note (Signed)
Addended by: Lorn Junes on: 07/07/2023 03:51 PM   Modules accepted: Orders

## 2023-07-07 NOTE — Assessment & Plan Note (Addendum)
Lab Results  Component Value Date   HGBA1C 11.2 (A) 07/07/2023   HGBA1C 9.0 (H) 03/11/2023   HGBA1C 8.3 09/18/2022   Not tracking glucose at home.  Dropped metformin to 500 mg BID due to N/V symptoms. -- this change has been good.  Insulin 14 units BID. Will titrate qPM dose 2 units every 2 days until AM glucose <140. Ozempic and Freestyle were not covered.  Will attempt to get Dexcom started if this is covered to monitor glucose.  Needs eye exam updated - gave info for free screenings here.

## 2023-07-07 NOTE — Assessment & Plan Note (Signed)
Worse secondary to lack of sleep, stress with mom, neuropathic pain. Increase amitriptyline to 50 mg at bedtime. Patient has hydroxyzine 50 to 100 mg at bedtime as needed. He also has a 25 mg tablet of hydroxyzine that he can take during the day as needed for panic. Consider consult with psych if worse or no improvement.

## 2023-07-07 NOTE — Progress Notes (Signed)
Subjective:    Patient ID: Eric Ortega, male    DOB: Dec 07, 1960, 62 y.o.   MRN: [id number]  Chief Complaint  Patient presents with   Diabetes    Pt in office for Diabetes f/u; states Neuropathy has got worse; and also not sleeping well; order placed for Colonoscopy;     Diabetes   Patient is in today for 4 month f/u. See A/P.   Mother placed on hospice yesterday.  Mother had 2 heart attacks then a stroke, has just stopped eating since then.   Staying very stressed, appetite is all over the place. Not sleeping at night. Working on some counseling with a Education officer, environmental.   Taking 2 amitriptyline at night, helping with the neuropathy. Gabapentin helps too.   Past Medical History:  Diagnosis Date   Diabetes mellitus without complication (HCC)    Hyperlipidemia    Hypertension     Past Surgical History:  Procedure Laterality Date   APPENDECTOMY      Family History  Problem Relation Age of Onset   Diabetes Mother    Heart failure Mother    Heart failure Father     Social History   Tobacco Use   Smoking status: Former   Smokeless tobacco: Never  Advertising account planner   Vaping status: Never Used  Substance Use Topics   Alcohol use: No   Drug use: No     No Known Allergies  Review of Systems NEGATIVE UNLESS OTHERWISE INDICATED IN HPI      Objective:     BP 134/74 (BP Location: Left Arm)   Pulse 69   Temp (!) 97.5 F (36.4 C) (Temporal)   Ht 6' (1.829 m)   Wt 213 lb 12.8 oz (97 kg)   SpO2 97%   BMI 29.00 kg/m   Wt Readings from Last 3 Encounters:  07/07/23 213 lb 12.8 oz (97 kg)  03/23/23 209 lb (94.8 kg)  03/11/23 209 lb 9.6 oz (95.1 kg)    BP Readings from Last 3 Encounters:  07/07/23 134/74  03/11/23 134/78  09/18/22 130/68     Physical Exam Vitals and nursing note reviewed.  Constitutional:      Appearance: Normal appearance.  Eyes:     Extraocular Movements: Extraocular movements intact.     Conjunctiva/sclera: Conjunctivae normal.     Pupils:  Pupils are equal, round, and reactive to light.  Cardiovascular:     Rate and Rhythm: Normal rate and regular rhythm.     Pulses: Normal pulses.  Pulmonary:     Effort: Pulmonary effort is normal.     Breath sounds: Normal breath sounds.  Skin:    Findings: Lesion (numerous picked / scabs arms and legs) present.  Neurological:     Mental Status: He is alert.  Psychiatric:        Mood and Affect: Mood normal.     Comments: Saddened today        Assessment & Plan:  Type 2 diabetes mellitus with diabetic neuropathy, with long-term current use of insulin (HCC) Assessment & Plan: Lab Results  Component Value Date   HGBA1C 11.2 (A) 07/07/2023   HGBA1C 9.0 (H) 03/11/2023   HGBA1C 8.3 09/18/2022   Not tracking glucose at home.  Dropped metformin to 500 mg BID due to N/V symptoms. -- this change has been good.  Insulin 14 units BID. Will titrate qPM dose 2 units every 2 days until AM glucose <140. Ozempic and Freestyle were not covered.  Will attempt to  get Dexcom started if this is covered to monitor glucose.  Needs eye exam updated - gave info for free screenings here.     Orders: -     POCT glycosylated hemoglobin (Hb A1C) -     Microalbumin / creatinine urine ratio -     Amitriptyline HCl; Take 1 tablet (50 mg total) by mouth at bedtime.  Dispense: 90 tablet; Refill: 1 -     Gabapentin; TAKE 1 CAPSULE BY MOUTH IN THE MORNING, 1 CAPSULE AT NOON, AND 2 CAPSULES AT BEDTIME  Dispense: 120 capsule; Refill: 2 -     Dexcom G7 Sensor; Apply sensor every 10 days. Pharmacy please provide 3 boxes (1 sensor per week)  Dispense: 3 each; Refill: 11 -     Dexcom G7 Receiver; Please provide 1 dexcom g7 receiver  Dispense: 1 each; Refill: 1 -     NovoLOG 70/30 FlexPen ReliOn; Inject 14 units subcutaneous qAM. Inject 14 units Gage qPM now, increase by 2 units every 2 days until AM sugar fasting is 140 or less. Max PM insulin dose is 60 units.  Dispense: 15 mL; Refill: 5  Insomnia due to medical  condition Assessment & Plan: Trouble falling asleep and staying asleep.  Worse recently with mother's health / hospice.   Taking amitriptyline 50 mg, two gabapentin 300 mg at bedtime, hydroxyzine 50-100 mg at bedtime occasionally (says this really only helps with anxiety though).   Will stop the hydroxyzine, trial Ramelteon in place of this.   Good sleep hygiene habits.   No drug use, no CBD habits. Alcohol 6 beers between Fri & Sat per pt.   Orders: -     Amitriptyline HCl; Take 1 tablet (50 mg total) by mouth at bedtime.  Dispense: 90 tablet; Refill: 1 -     Ramelteon; Take 1 tablet (8 mg total) by mouth at bedtime.  Dispense: 30 tablet; Refill: 2  Anxiety and depression Assessment & Plan: Worse secondary to lack of sleep, stress with mom, neuropathic pain. Increase amitriptyline to 50 mg at bedtime. Patient has hydroxyzine 50 to 100 mg at bedtime as needed. He also has a 25 mg tablet of hydroxyzine that he can take during the day as needed for panic. Consider consult with psych if worse or no improvement.  Orders: -     Gabapentin; TAKE 1 CAPSULE BY MOUTH IN THE MORNING, 1 CAPSULE AT NOON, AND 2 CAPSULES AT BEDTIME  Dispense: 120 capsule; Refill: 2  Immunization due -     Flu vaccine trivalent PF, 6mos and older(Flulaval,Afluria,Fluarix,Fluzone)  Screening for colon cancer -     Ambulatory referral to Gastroenterology  Skin picking habit  --secondary to anxiety; may need to consider psychiatry       Return in about 4 weeks (around 08/04/2023) for recheck/follow-up / glucose / insomnia .    Tosca Pletz M Jyllian Haynie, PA-C

## 2023-07-07 NOTE — Assessment & Plan Note (Addendum)
Trouble falling asleep and staying asleep.  Worse recently with mother's health / hospice.   Taking amitriptyline 50 mg, two gabapentin 300 mg at bedtime, hydroxyzine 50-100 mg at bedtime occasionally (says this really only helps with anxiety though).   Will stop the hydroxyzine, trial Ramelteon in place of this.   Good sleep hygiene habits.   No drug use, no CBD habits. Alcohol 6 beers between Fri & Sat per pt.

## 2023-08-03 ENCOUNTER — Encounter: Payer: Self-pay | Admitting: Internal Medicine

## 2023-10-06 ENCOUNTER — Other Ambulatory Visit: Payer: Self-pay | Admitting: Physician Assistant

## 2023-10-26 ENCOUNTER — Other Ambulatory Visit: Payer: Self-pay | Admitting: Physician Assistant

## 2023-10-26 DIAGNOSIS — E1140 Type 2 diabetes mellitus with diabetic neuropathy, unspecified: Secondary | ICD-10-CM

## 2023-10-26 DIAGNOSIS — F32A Depression, unspecified: Secondary | ICD-10-CM

## 2023-10-26 DIAGNOSIS — G4701 Insomnia due to medical condition: Secondary | ICD-10-CM

## 2023-10-26 MED ORDER — RAMELTEON 8 MG PO TABS: 8.0000 mg | ORAL_TABLET | Freq: Every day | ORAL | 2 refills | Status: DC

## 2023-10-26 NOTE — Telephone Encounter (Signed)
Copied from CRM [id number]. Topic: Clinical - Medication Refill >> Oct 26, 2023 10:34 AM Myrtice Lauth wrote: Most Recent Primary Care Visit:  Provider: Bary Leriche  Department: [facility]  Visit Type: OFFICE VISIT  Date: 07/07/2023  Medication: gabapentin (NEURONTIN) 300 MG capsule , ramelteon (ROZEREM) 8 MG tablet  Has the patient contacted their pharmacy? Yes (Agent: If no, request that the patient contact the pharmacy for the refill. If patient does not wish to contact the pharmacy document the reason why and proceed with request.) (Agent: If yes, when and what did the pharmacy advise?)  Is this the correct pharmacy for this prescription? Yes If no, delete pharmacy and type the correct one.  This is the patient's preferred pharmacy:  [facility] [address] [place] - [address] [place] [address] Phone: [phone] Fax: [phone]   Has the prescription been filled recently? Yes  Is the patient out of the medication? Yes  Has the patient been seen for an appointment in the last year OR does the patient have an upcoming appointment? Yes  Can we respond through MyChart? No  Agent: Please be advised that Rx refills may take up to 3 business days. We ask that you follow-up with your pharmacy.

## 2023-10-26 NOTE — Telephone Encounter (Signed)
Copied from CRM [id number]. Topic: Clinical - Medication Refill >> Oct 26, 2023 10:38 AM Myrtice Lauth wrote: Most Recent Primary Care Visit:  Provider: Bary Leriche  Department: [facility]  Visit Type: OFFICE VISIT  Date: 07/07/2023  Medication: ramelteon (ROZEREM) 8 MG tablet, gabapentin (NEURONTIN) 300 MG capsule  Has the patient contacted their pharmacy? Yes (Agent: If no, request that the patient contact the pharmacy for the refill. If patient does not wish to contact the pharmacy document the reason why and proceed with request.) (Agent: If yes, when and what did the pharmacy advise?)  Is this the correct pharmacy for this prescription? Yes If no, delete pharmacy and type the correct one.  This is the patient's preferred pharmacy:  [facility] [address], [place] - [address] [place] [address] Phone: [phone] Fax: [phone]   Has the prescription been filled recently? Yes  Is the patient out of the medication? Yes  Has the patient been seen for an appointment in the last year OR does the patient have an upcoming appointment? Yes  Can we respond through MyChart? Yes  Agent: Please be advised that Rx refills may take up to 3 business days. We ask that you follow-up with your pharmacy.

## 2023-11-10 IMAGING — MR MR LUMBAR SPINE WO/W CM
4 of 7 series · 19 of 48 positions shown · IV contrast (Yes GAD)
Comparison: None.

CLINICAL DATA: Right foot drop

EXAM:
MRI LUMBAR SPINE WITHOUT AND WITH CONTRAST
TECHNIQUE: Multiplanar and multiecho pulse sequences of the lumbar spine were
obtained without and with intravenous contrast.
CONTRAST:  8.5mL GADAVIST GADOBUTROL 1 MMOL/ML IV SOLN

[Series 4: T1 · sagittal · 4.0mm · 0.55mm/px · 3 of 15 slices shown (1 of 2)]
[im 1/15]
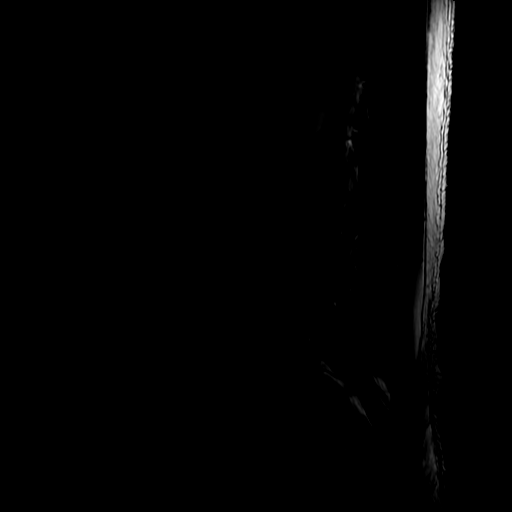
[im 10/15]
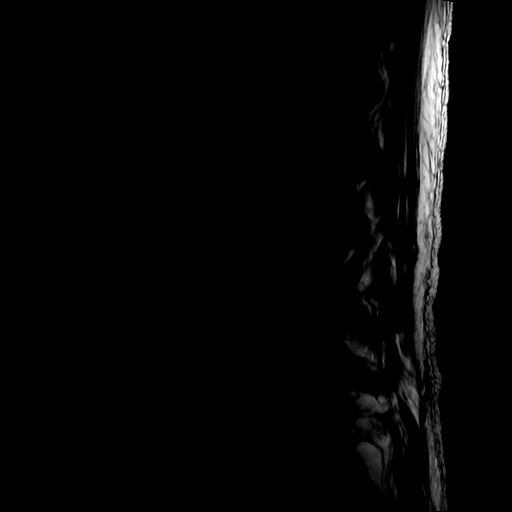
[im 15/15]
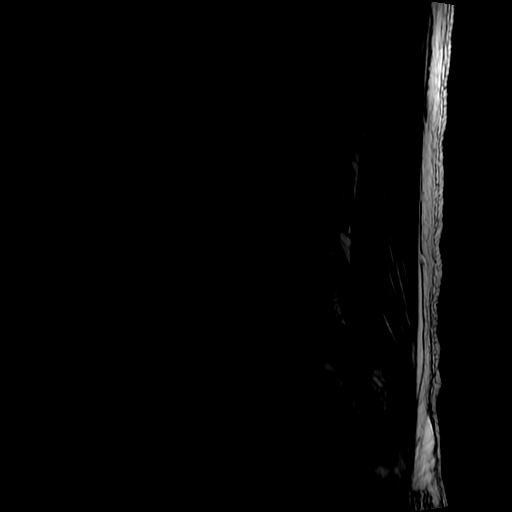

[Series 5: T2 · axial · 4.0mm · 0.39mm/px · z∈[-188,+3]mm · 10 of 45 slices shown]
[im 1/45]
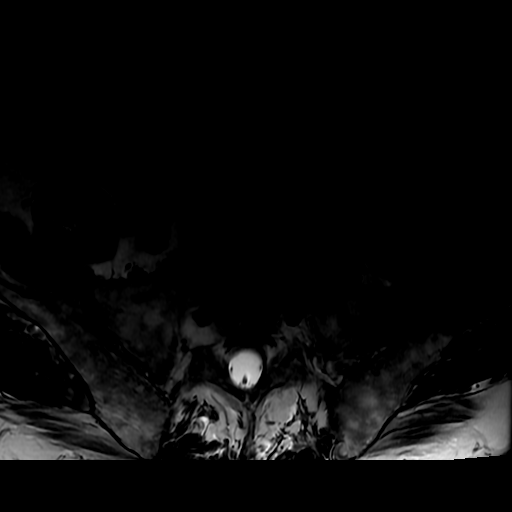
[im 5/45]
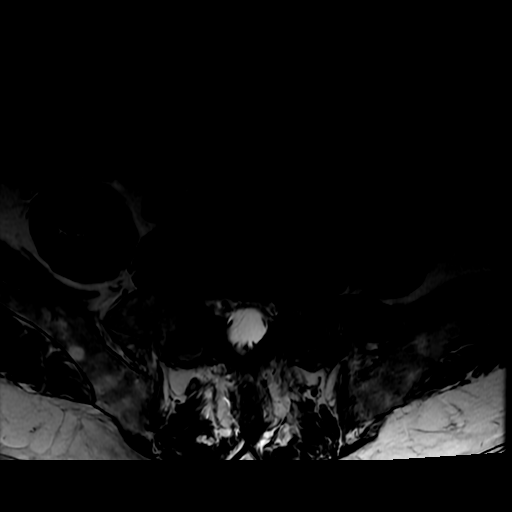
[im 9/45]
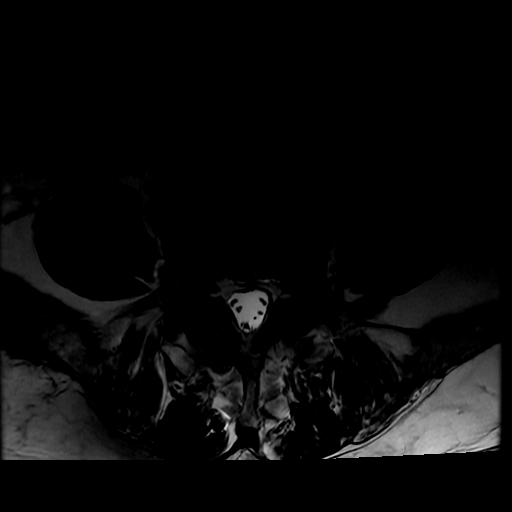
[im 14/45]
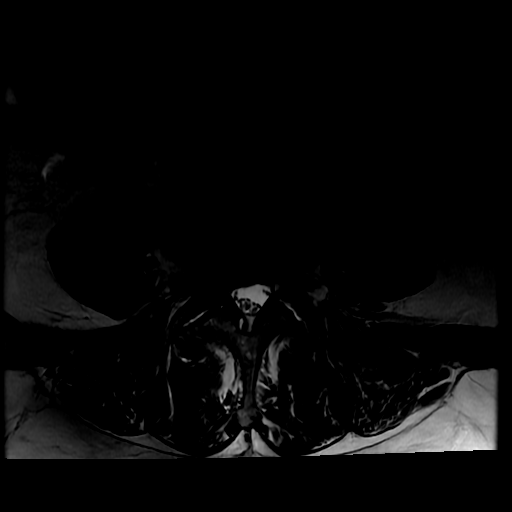
[im 18/45]
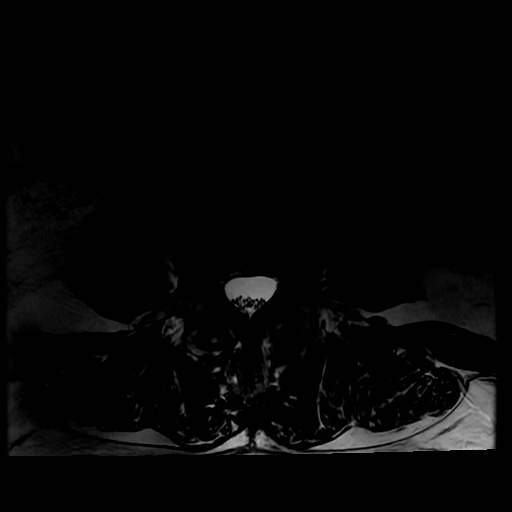
[im 23/45]
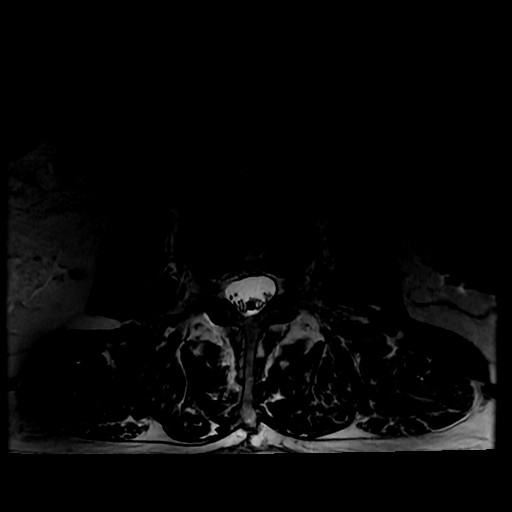
[im 27/45]
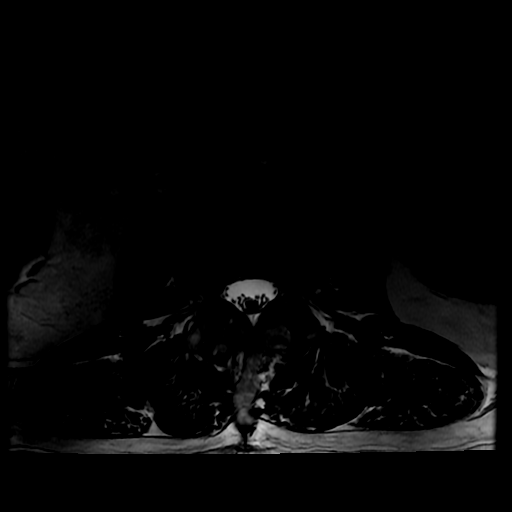
[im 31/45]
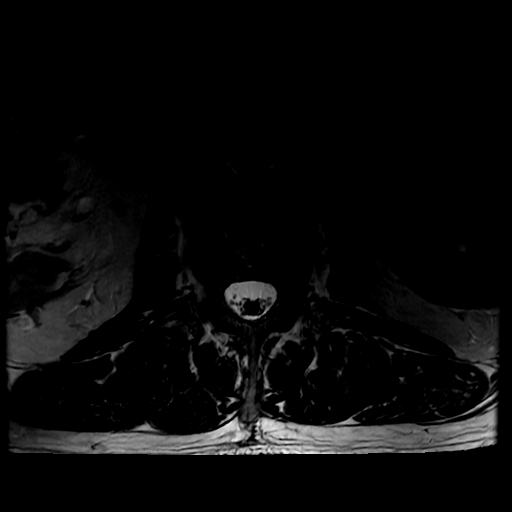
[im 36/45]
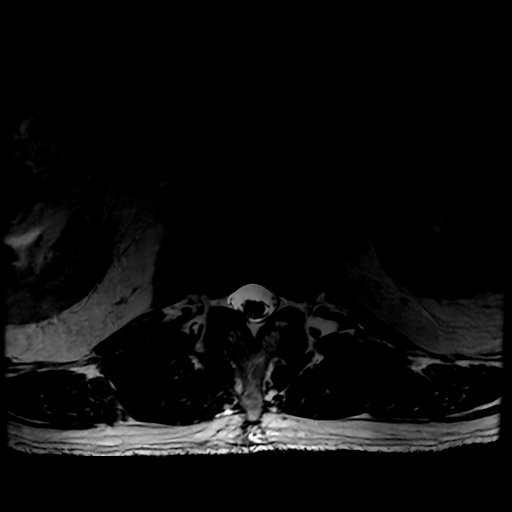
[im 40/45]
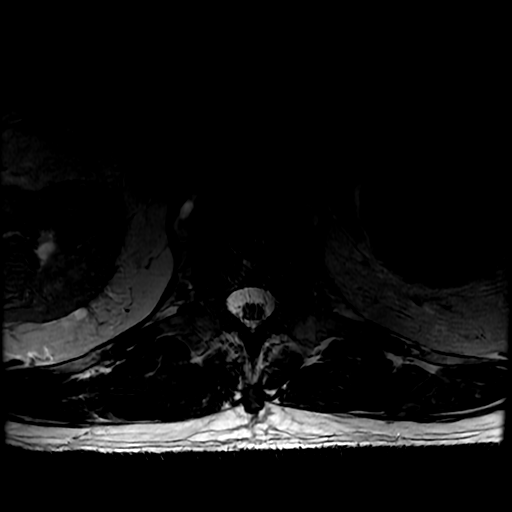

[Series 6: T1 · axial · 4.0mm · 0.39mm/px · z∈[-169,+3]mm · 3 of 45 slices shown (2 of 2)]
[im 5/45]
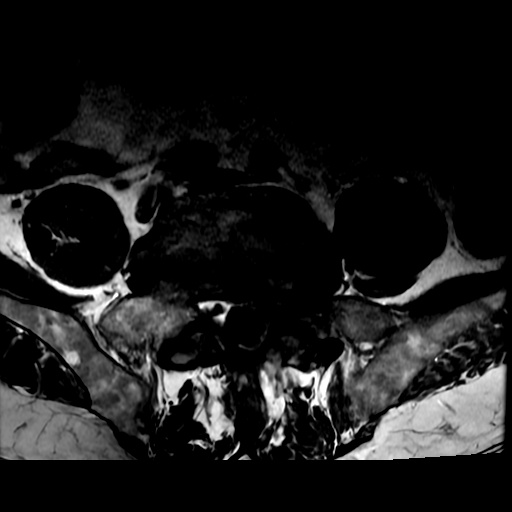
[im 23/45]
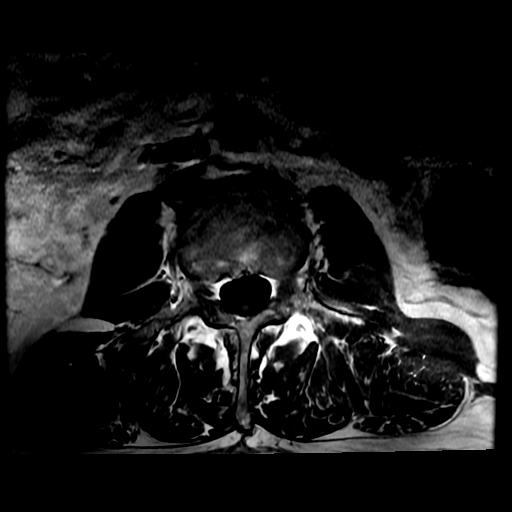
[im 40/45]
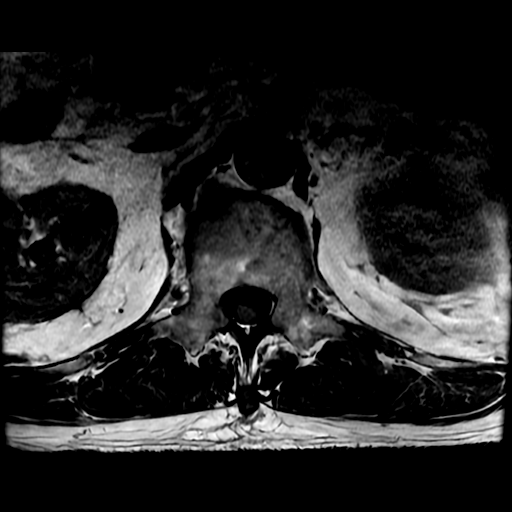

[Series 7: T2 post-contrast · sagittal · 4.0mm · 0.55mm/px · 3 of 15 slices shown]
[im 1/15]
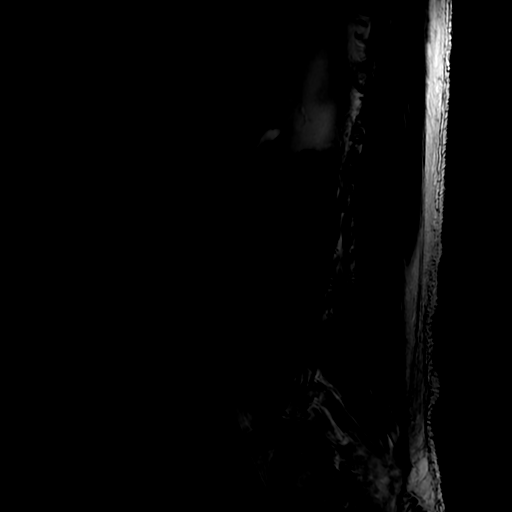
[im 10/15]
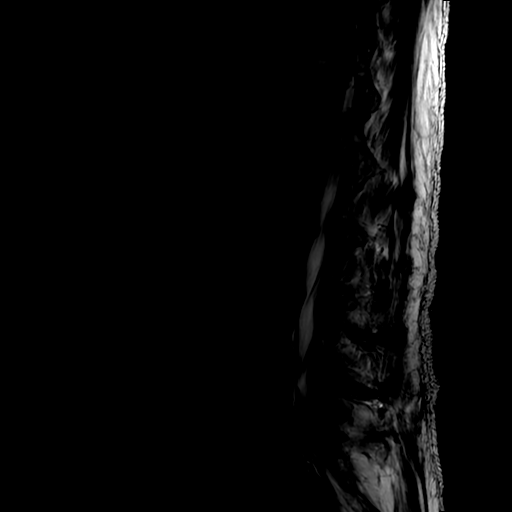
[im 15/15]
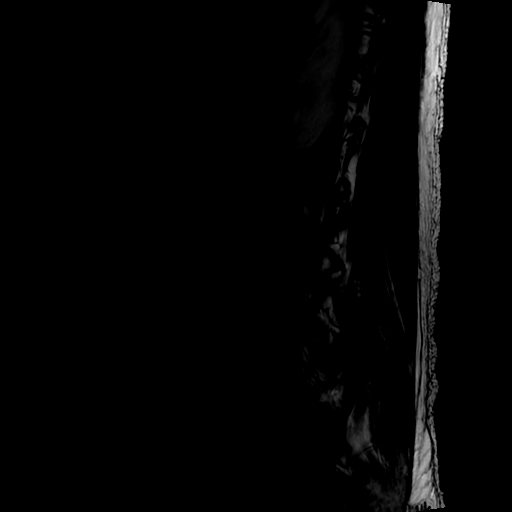

[19 of 48 positions shown; findings below may reference images not displayed]

FINDINGS: Segmentation:  Standard.

Alignment:  Physiologic.

Vertebrae:  No fracture, evidence of discitis, or bone lesion.

Conus medullaris and cauda equina: Conus extends to the L3 level.
Conus and cauda equina appear normal.

Paraspinal and other soft tissues: Negative

Disc levels:

L1-L2: Small left foraminal protrusion. No spinal canal stenosis. No
neural foraminal stenosis.

L2-L3: Normal disc space and facet joints. No spinal canal stenosis.
No neural foraminal stenosis.

L3-L4: Small right foraminal disc protrusion. No spinal canal
stenosis. Mild right neural foraminal stenosis.

L4-L5: Moderate facet hypertrophy with right asymmetric disc bulge,
unchanged. Right lateral recess narrowing without central spinal
canal stenosis. Unchanged mild bilateral neural foraminal stenosis.

L5-S1: Small disc bulge, unchanged. Right lateral recess narrowing
without central spinal canal stenosis. Unchanged severe bilateral
neural foraminal stenosis.

Visualized sacrum: Normal.
IMPRESSION: 1. Unchanged examination of the lumbar spine with severe bilateral
L5-S1 neural foraminal stenosis.
2. Unchanged right lateral recess narrowing at L4-L5 and L5-S[DATE]
serve as a source of radiculopathy.
3. Low-lying conus medullaris.

## 2023-11-23 ENCOUNTER — Other Ambulatory Visit: Payer: Self-pay | Admitting: Physician Assistant

## 2023-12-04 ENCOUNTER — Other Ambulatory Visit: Payer: Self-pay | Admitting: Physician Assistant

## 2023-12-04 DIAGNOSIS — Z794 Long term (current) use of insulin: Secondary | ICD-10-CM

## 2023-12-04 DIAGNOSIS — F419 Anxiety disorder, unspecified: Secondary | ICD-10-CM

## 2023-12-04 DIAGNOSIS — E1140 Type 2 diabetes mellitus with diabetic neuropathy, unspecified: Secondary | ICD-10-CM

## 2024-01-15 ENCOUNTER — Other Ambulatory Visit: Payer: Self-pay | Admitting: Physician Assistant

## 2024-01-15 DIAGNOSIS — G4701 Insomnia due to medical condition: Secondary | ICD-10-CM

## 2024-01-15 DIAGNOSIS — E1140 Type 2 diabetes mellitus with diabetic neuropathy, unspecified: Secondary | ICD-10-CM

## 2024-01-15 DIAGNOSIS — F32A Depression, unspecified: Secondary | ICD-10-CM

## 2024-01-15 DIAGNOSIS — Z794 Long term (current) use of insulin: Secondary | ICD-10-CM

## 2024-01-15 NOTE — Telephone Encounter (Signed)
 Copied from CRM [id number]. Topic: Clinical - Medication Refill >> Jan 15, 2024  3:28 PM Truddie Crumble wrote: Most Recent Primary Care Visit:  Provider: Bary Leriche  Department: [facility]  Visit Type: OFFICE VISIT  Date: 07/07/2023  Medication: gabapentin (NEURONTIN) 300 MG capsule and amitriptyline (ELAVIL) 50 MG tablet  Has the patient contacted their pharmacy? Yes (Agent: If no, request that the patient contact the pharmacy for the refill. If patient does not wish to contact the pharmacy document the reason why and proceed with request.) (Agent: If yes, when and what did the pharmacy advise?)  Is this the correct pharmacy for this prescription? Yes If no, delete pharmacy and type the correct one.  This is the patient's preferred pharmacy:  [facility] [address] [place] - [address] [place] [address] Phone: [phone] Fax: [phone]   Has the prescription been filled recently? No  Is the patient out of the medication? Yes  Has the patient been seen for an appointment in the last year OR does the patient have an upcoming appointment? Yes  Can we respond through MyChart? Yes  Agent: Please be advised that Rx refills may take up to 3 business days. We ask that you follow-up with your pharmacy.

## 2024-01-16 ENCOUNTER — Other Ambulatory Visit: Payer: Self-pay | Admitting: Physician Assistant

## 2024-01-16 DIAGNOSIS — G4701 Insomnia due to medical condition: Secondary | ICD-10-CM

## 2024-01-16 DIAGNOSIS — E1140 Type 2 diabetes mellitus with diabetic neuropathy, unspecified: Secondary | ICD-10-CM

## 2024-01-16 DIAGNOSIS — F32A Anxiety disorder, unspecified: Secondary | ICD-10-CM

## 2024-01-18 MED ORDER — GABAPENTIN 300 MG PO CAPS: ORAL_CAPSULE | ORAL | 0 refills | Status: DC

## 2024-01-19 ENCOUNTER — Ambulatory Visit: Admitting: Physician Assistant

## 2024-03-02 ENCOUNTER — Emergency Department (HOSPITAL_COMMUNITY)
Admission: EM | Admit: 2024-03-02 | Discharge: 2024-03-02 | Disposition: A | Discharge status: Home / Self Care | Attending: Emergency Medicine | Admitting: Emergency Medicine

## 2024-03-02 ENCOUNTER — Encounter (HOSPITAL_COMMUNITY): Payer: Self-pay

## 2024-03-02 ENCOUNTER — Other Ambulatory Visit: Payer: Self-pay

## 2024-03-02 DIAGNOSIS — E119 Type 2 diabetes mellitus without complications: Secondary | ICD-10-CM | POA: Insufficient documentation

## 2024-03-02 DIAGNOSIS — I10 Essential (primary) hypertension: Secondary | ICD-10-CM | POA: Insufficient documentation

## 2024-03-02 DIAGNOSIS — I471 Supraventricular tachycardia, unspecified: Secondary | ICD-10-CM | POA: Insufficient documentation

## 2024-03-02 DIAGNOSIS — R008 Other abnormalities of heart beat: Secondary | ICD-10-CM | POA: Diagnosis not present

## 2024-03-02 DIAGNOSIS — R0609 Other forms of dyspnea: Secondary | ICD-10-CM | POA: Diagnosis not present

## 2024-03-02 DIAGNOSIS — R42 Dizziness and giddiness: Secondary | ICD-10-CM | POA: Insufficient documentation

## 2024-03-02 DIAGNOSIS — R002 Palpitations: Secondary | ICD-10-CM | POA: Diagnosis present

## 2024-03-02 DIAGNOSIS — I498 Other specified cardiac arrhythmias: Secondary | ICD-10-CM | POA: Insufficient documentation

## 2024-03-02 LAB — CBC WITH DIFFERENTIAL/PLATELET
Abs Immature Granulocytes: 0.03 10*3/uL (ref 0.00–0.07)
Basophils Absolute: 0.1 10*3/uL (ref 0.0–0.1)
Basophils Relative: 1 %
Eosinophils Absolute: 0.2 10*3/uL (ref 0.0–0.5)
Eosinophils Relative: 3 %
HCT: 43.5 % (ref 39.0–52.0)
Hemoglobin: 15.1 g/dL (ref 13.0–17.0)
Immature Granulocytes: 1 %
Lymphocytes Relative: 16 %
Lymphs Abs: 0.9 10*3/uL (ref 0.7–4.0)
MCH: 28.1 pg (ref 26.0–34.0)
MCHC: 34.7 g/dL (ref 30.0–36.0)
MCV: 81 fL (ref 80.0–100.0)
Monocytes Absolute: 0.7 10*3/uL (ref 0.1–1.0)
Monocytes Relative: 13 %
Neutro Abs: 3.5 10*3/uL (ref 1.7–7.7)
Neutrophils Relative %: 66 %
Platelets: 99 10*3/uL — ABNORMAL LOW (ref 150–400)
RBC: 5.37 MIL/uL (ref 4.22–5.81)
RDW: 13.9 % (ref 11.5–15.5)
WBC: 5.3 10*3/uL (ref 4.0–10.5)
nRBC: 0 % (ref 0.0–0.2)

## 2024-03-02 LAB — COMPREHENSIVE METABOLIC PANEL WITH GFR
ALT: 34 U/L (ref 0–44)
AST: 41 U/L (ref 15–41)
Albumin: 3.4 g/dL — ABNORMAL LOW (ref 3.5–5.0)
Alkaline Phosphatase: 131 U/L — ABNORMAL HIGH (ref 38–126)
Anion gap: 14 (ref 5–15)
BUN: 8 mg/dL (ref 8–23)
CO2: 19 mmol/L — ABNORMAL LOW (ref 22–32)
Calcium: 9 mg/dL (ref 8.9–10.3)
Chloride: 103 mmol/L (ref 98–111)
Creatinine, Ser: 0.79 mg/dL (ref 0.61–1.24)
GFR, Estimated: >60 mL/min (ref >60)
Glucose, Bld: 361 mg/dL — ABNORMAL HIGH (ref 70–99)
Potassium: 4.4 mmol/L (ref 3.5–5.1)
Sodium: 136 mmol/L (ref 135–145)
Total Bilirubin: 2 mg/dL — ABNORMAL HIGH (ref 0.0–1.2)
Total Protein: 6.9 g/dL (ref 6.5–8.1)

## 2024-03-02 LAB — MAGNESIUM: Magnesium: 2 mg/dL (ref 1.7–2.4)

## 2024-03-02 MED ORDER — SODIUM CHLORIDE 0.9 % IV BOLUS
500.0000 mL | Freq: Once | INTRAVENOUS | Status: AC
  Administered 2024-03-02: 500 mL via INTRAVENOUS

## 2024-03-02 MED ORDER — METOPROLOL TARTRATE 5 MG/5ML IV SOLN
5.0000 mg | Freq: Once | INTRAVENOUS | Status: AC
  Administered 2024-03-02: 5 mg via INTRAVENOUS
  Filled 2024-03-02: qty 5

## 2024-03-02 MED ORDER — METOPROLOL TARTRATE 50 MG PO TABS: 50.0000 mg | ORAL_TABLET | Freq: Once | ORAL | 0 refills | Status: DC | PRN

## 2024-03-02 MED ORDER — ADENOSINE 6 MG/2ML IV SOLN
12.0000 mg | Freq: Once | INTRAVENOUS | Status: AC
  Administered 2024-03-02: 12 mg via INTRAVENOUS
  Filled 2024-03-02: qty 4

## 2024-03-02 MED ORDER — ADENOSINE 6 MG/2ML IV SOLN
18.0000 mg | Freq: Once | INTRAVENOUS | Status: AC
  Administered 2024-03-02: 18 mg via INTRAVENOUS

## 2024-03-02 NOTE — ED Provider Notes (Signed)
 Emergency Department Provider Note   I have reviewed the triage vital signs and the nursing notes.   HISTORY  Chief Complaint Tachycardia   HPI Eric Ortega is a 63 y.o. male with past medical history reviewed below including hypertension, hyperlipidemia, diabetes presents emergency department with acute onset palpitations and lightheadedness.  He awoke this morning with symptoms.  He went to urgent care was found to be in SVT.  EMS was called and administered 6 mg of adenosine  followed by 12 mg and another 12 mg dose.  No reported pause or change in rhythm.  Patient denies chest pain or shortness of breath.  No excessive caffeine or drug use.  Past Medical History:  Diagnosis Date   Diabetes mellitus without complication (HCC)    Hyperlipidemia    Hypertension     Review of Systems  Constitutional: No fever/chills. Positive weakness.  Cardiovascular: Denies chest pain. Positive palpitations.  Respiratory: Denies shortness of breath. Gastrointestinal: No abdominal pain.  No nausea, no vomiting.   Musculoskeletal: Negative for back pain. Skin: Negative for rash. Neurological: Negative for headache.  ____________________________________________   PHYSICAL EXAM:  VITAL SIGNS: ED Triage Vitals  Encounter Vitals Group     BP 03/02/24 1223 (!) 112/92     Pulse Rate 03/02/24 1223 (!) 169     Resp 03/02/24 1223 16     Temp 03/02/24 1220 98.3 F (36.8 C)     Temp src --      SpO2 03/02/24 1223 100 %     Weight 03/02/24 1223 213 lb (96.6 kg)     Height 03/02/24 1223 6' (1.829 m)   Constitutional: Alert and oriented. Well appearing and in no acute distress. Eyes: Conjunctivae are normal.  Head: Atraumatic. Nose: No congestion/rhinnorhea. Mouth/Throat: Mucous membranes are moist.   Neck: No stridor.   Cardiovascular: SVT. Good peripheral circulation. Grossly normal heart sounds.   Respiratory: Normal respiratory effort.  No retractions. Lungs CTAB. Gastrointestinal:  Soft and nontender. No distention.  Musculoskeletal: No gross deformities of extremities. Neurologic:  Normal speech and language. No gross focal neurologic deficits are appreciated.  Skin:  Skin is warm, dry and intact. No rash noted.  ____________________________________________   LABS (all labs ordered are listed, but only abnormal results are displayed)  Labs Reviewed  COMPREHENSIVE METABOLIC PANEL WITH GFR - Abnormal; Notable for the following components:      Result Value   CO2 19 (*)    Glucose, Bld 361 (*)    Albumin 3.4 (*)    Alkaline Phosphatase 131 (*)    Total Bilirubin 2.0 (*)    All other components within normal limits  CBC WITH DIFFERENTIAL/PLATELET - Abnormal; Notable for the following components:   Platelets 99 (*)    All other components within normal limits  MAGNESIUM    ____________________________________________  EKG   EKG Interpretation Date/Time:  Wednesday Mar 02 2024 12:21:03 EDT Ventricular Rate:  174 PR Interval:    QRS Duration:  104 QT Interval:  272 QTC Calculation: 463 R Axis:   -39  Text Interpretation: Supraventricular tachycardia Left axis deviation Borderline low voltage, extremity leads Anteroseptal infarct, old Repolarization abnormality, prob rate related Confirmed by Hershel Los [phone]) on 03/03/2024 8:21:10 AM       ____________________________________________   PROCEDURES  Procedure(s) performed:   .Cardioversion  Date/Time: 03/02/2024 2:43 PM  Performed by: Roberts Ching, MD Authorized by: Roberts Ching, MD   Consent:    Consent obtained:  Emergent situation  Risks discussed:  Pain and induced arrhythmia   Alternatives discussed:  Rate-control medication Pre-procedure details:    Cardioversion basis:  Emergent   Rhythm:  Supraventricular tachycardia Attempt one:    Cardioversion mode attempt one: adenosine  12 mg.   Shock outcome:  No change in rhythm Attempt two:    Cardioversion mode attempt two:  adenosine  18 mg.   Shock outcome:  Conversion to normal sinus rhythm Post-procedure details:    Patient status:  Awake   Patient tolerance of procedure:  Tolerated well, no immediate complications .Critical Care  Performed by: Roberts Ching, MD Authorized by: Roberts Ching, MD   Critical care provider statement:    Critical care time (minutes):  30   Critical care time was exclusive of:  Separately billable procedures and treating other patients and teaching time   Critical care was necessary to treat or prevent imminent or life-threatening deterioration of the following conditions:  Circulatory failure   Critical care was time spent personally by me on the following activities:  Development of treatment plan with patient or surrogate, discussions with consultants, evaluation of patient's response to treatment, examination of patient, ordering and review of laboratory studies, ordering and review of radiographic studies, ordering and performing treatments and interventions, pulse oximetry, re-evaluation of patient's condition and review of old charts   I assumed direction of critical care for this patient from another provider in my specialty: no      ____________________________________________   INITIAL IMPRESSION / ASSESSMENT AND PLAN / ED COURSE  Pertinent labs & imaging results that were available during my care of the patient were reviewed by me and considered in my medical decision making (see chart for details).   This patient is Presenting for Evaluation of palpitations, which does require a range of treatment options, and is a complaint that involves a high risk of morbidity and mortality.  The Differential Diagnoses include SVT, AVNRT, A fib RVR, etc.  Critical Interventions-    Medications  sodium chloride  0.9 % bolus 500 mL (0 mLs Intravenous Stopped 03/02/24 1517)  adenosine  (ADENOCARD ) 6 MG/2ML injection 12 mg (12 mg Intravenous Given 03/02/24 1238)  metoprolol   tartrate (LOPRESSOR ) injection 5 mg (5 mg Intravenous Given 03/02/24 1253)  adenosine  (ADENOCARD ) 6 MG/2ML injection 18 mg (18 mg Intravenous Given 03/02/24 1246)    Reassessment after intervention:  converted to NSR.   I did obtain Additional Historical Information from EMS.     Clinical Laboratory Tests Ordered, included normal potassium and magnesium .  No AKI.  EMEA.  Cardiac Monitor Tracing which shows SVT  Medical Decision Making: Summary:  Patient arrives to the emergency department in SVT.  No successful conversion to sinus rhythm with EMS.  I tried an additional 12 mg dose of adenosine  with no response.  I then tried an 18 mg dose with a brief pause and conversion to normal sinus rhythm.  Patient given metoprolol  and monitored in the ED.  Reevaluation with update and discussion with patient.  He continues to feel well.  Plan for pill in pocket metoprolol  and cardiology follow-up.  Considered admission but remains in NSR with normal labs. Plan for Cardiology follow up.   Patient's presentation is most consistent with acute presentation with potential threat to life or bodily function.   Disposition: discharge  ____________________________________________  FINAL CLINICAL IMPRESSION(S) / ED DIAGNOSES  Final diagnoses:  SVT (supraventricular tachycardia) (HCC)     NEW OUTPATIENT MEDICATIONS STARTED DURING THIS VISIT:  Discharge Medication List as  of 03/02/2024  3:17 PM     START taking these medications   Details  metoprolol  tartrate (LOPRESSOR ) 50 MG tablet Take 1 tablet (50 mg total) by mouth once as needed for up to 1 dose (palpitations)., Starting Wed 03/02/2024, Normal        Note:  This document was prepared using Dragon voice recognition software and may include unintentional dictation errors.  Abby Hocking, MD, [facility]    Aaima Gaddie, Shereen Dike, MD 03/17/24 [phone]

## 2024-03-02 NOTE — ED Notes (Signed)
 Called CCMD.

## 2024-03-02 NOTE — Discharge Instructions (Signed)
 You were seen in the emergency room today with supraventricular tachycardia.  We are able to get you back into normal rhythm.  I have called in a prescription for metoprolol to take if you feel return of your heart palpitations.  If they do not resolve after 10 to 15 minutes he should call 911.  I would also like you to reestablish care with cardiology as you have required intervention with this in the past.

## 2024-03-02 NOTE — ED Triage Notes (Signed)
 Reports woke up this morning with palpitations, went to UC and found to be in SVT EMS tried vagal, and adenosine  6, 12, 12 and no response.

## 2024-03-09 ENCOUNTER — Other Ambulatory Visit: Payer: Self-pay | Admitting: Physician Assistant

## 2024-03-09 DIAGNOSIS — F32A Depression, unspecified: Secondary | ICD-10-CM

## 2024-03-09 DIAGNOSIS — E1140 Type 2 diabetes mellitus with diabetic neuropathy, unspecified: Secondary | ICD-10-CM

## 2024-03-09 MED ORDER — GABAPENTIN 300 MG PO CAPS: ORAL_CAPSULE | ORAL | 0 refills | Status: DC

## 2024-03-09 NOTE — Telephone Encounter (Signed)
 Copied from CRM [id number]. Topic: Clinical - Medication Refill >> Mar 09, 2024 11:39 AM Clyde Darling P wrote: Medication: gabapentin  (NEURONTIN ) 300 MG capsule  Has the patient contacted their pharmacy? Yes- nomore refills  (Agent: If no, request that the patient contact the pharmacy for the refill. If patient does not wish to contact the pharmacy document the reason why and proceed with request.) (Agent: If yes, when and what did the pharmacy advise?)  This is the patient's preferred pharmacy:  [facility] [address] [place] - [address] [place] [address] Phone: [phone] Fax: [phone]  Is this the correct pharmacy for this prescription? Yes If no, delete pharmacy and type the correct one.   Has the prescription been filled recently? No  Is the patient out of the medication? Yes  Has the patient been seen for an appointment in the last year OR does the patient have an upcoming appointment? Yes  Can we respond through MyChart? Yes  Agent: Please be advised that Rx refills may take up to 3 business days. We ask that you follow-up with your pharmacy.

## 2024-03-15 ENCOUNTER — Inpatient Hospital Stay: Admitting: Physician Assistant

## 2024-04-03 ENCOUNTER — Other Ambulatory Visit: Payer: Self-pay | Admitting: Physician Assistant

## 2024-04-05 ENCOUNTER — Other Ambulatory Visit: Payer: Self-pay | Admitting: Physician Assistant

## 2024-04-05 DIAGNOSIS — E1140 Type 2 diabetes mellitus with diabetic neuropathy, unspecified: Secondary | ICD-10-CM

## 2024-04-05 DIAGNOSIS — F419 Anxiety disorder, unspecified: Secondary | ICD-10-CM

## 2024-04-19 ENCOUNTER — Telehealth: Payer: Self-pay

## 2024-04-19 ENCOUNTER — Telehealth: Payer: Self-pay | Admitting: Physician Assistant

## 2024-04-19 NOTE — Telephone Encounter (Signed)
 Copied from CRM [id number]. Topic: Clinical - Medication Refill >> Apr 19, 2024 10:22 AM Chasity T wrote: Medication: ramelteon  (ROZEREM ) 8 MG tablet   Has the patient contacted their pharmacy? Yes  This is the patient's preferred pharmacy:  [facility] [address], [place] - [address] [place] [address] Phone: [phone] Fax: [phone]  Is this the correct pharmacy for this prescription? Yes If no, delete pharmacy and type the correct one.   Has the prescription been filled recently? No  Is the patient out of the medication? Yes  Has the patient been seen for an appointment in the last year OR does the patient have an upcoming appointment? No  Can we respond through MyChart? Yes  Agent: Please be advised that Rx refills may take up to 3 business days. We ask that you follow-up with your pharmacy.  Returned pt call and lvm to contact office to schedule office visit with PCP; per PCP no more refills of medications without an OV. If patient returns call please schedule patient for Diabetes follow-up/ medication refills.

## 2024-04-21 ENCOUNTER — Telehealth: Payer: Self-pay

## 2024-04-21 NOTE — Telephone Encounter (Signed)
 Copied from CRM [id number]. Topic: Clinical - Medication Question >> Apr 21, 2024 10:46 AM Harlene ORN wrote: Reason for CRM: Rich/ REP from [facility] called to get a verbal confirmation of the patient's diagnosis of diabetes for the patient to remain eligible for chronic conditions special needs plan.  [employer] Phone: [phone]  Please see call from [facility] rep regarding pt Diabetes Dx; have been unsuccessful in reaching patient for appointment needed in office.

## 2024-04-21 NOTE — Telephone Encounter (Signed)
 Noted. Will address at o/v with me.

## 2024-04-26 ENCOUNTER — Encounter: Payer: Self-pay | Admitting: Physician Assistant

## 2024-04-26 ENCOUNTER — Ambulatory Visit (INDEPENDENT_AMBULATORY_CARE_PROVIDER_SITE_OTHER): Admitting: Physician Assistant

## 2024-04-26 VITALS — BP 128/70 | HR 71 | Temp 98.2°F | Ht 72.0 in | Wt 199.0 lb

## 2024-04-26 DIAGNOSIS — F419 Anxiety disorder, unspecified: Secondary | ICD-10-CM | POA: Diagnosis not present

## 2024-04-26 DIAGNOSIS — E1140 Type 2 diabetes mellitus with diabetic neuropathy, unspecified: Secondary | ICD-10-CM | POA: Diagnosis not present

## 2024-04-26 DIAGNOSIS — Z794 Long term (current) use of insulin: Secondary | ICD-10-CM

## 2024-04-26 DIAGNOSIS — D693 Immune thrombocytopenic purpura: Secondary | ICD-10-CM | POA: Diagnosis not present

## 2024-04-26 DIAGNOSIS — I471 Supraventricular tachycardia, unspecified: Secondary | ICD-10-CM

## 2024-04-26 DIAGNOSIS — G4701 Insomnia due to medical condition: Secondary | ICD-10-CM | POA: Diagnosis not present

## 2024-04-26 DIAGNOSIS — E782 Mixed hyperlipidemia: Secondary | ICD-10-CM

## 2024-04-26 DIAGNOSIS — Z1211 Encounter for screening for malignant neoplasm of colon: Secondary | ICD-10-CM

## 2024-04-26 DIAGNOSIS — Z125 Encounter for screening for malignant neoplasm of prostate: Secondary | ICD-10-CM | POA: Diagnosis not present

## 2024-04-26 DIAGNOSIS — F32A Depression, unspecified: Secondary | ICD-10-CM

## 2024-04-26 LAB — COMPREHENSIVE METABOLIC PANEL WITH GFR
ALT: 26 U/L (ref 0–53)
AST: 35 U/L (ref 0–37)
Albumin: 3.9 g/dL (ref 3.5–5.2)
Alkaline Phosphatase: 162 U/L — ABNORMAL HIGH (ref 39–117)
BUN: 15 mg/dL (ref 6–23)
CO2: 25 meq/L (ref 19–32)
Calcium: 9 mg/dL (ref 8.4–10.5)
Chloride: 105 meq/L (ref 96–112)
Creatinine, Ser: 0.73 mg/dL (ref 0.40–1.50)
GFR: 95.57 mL/min (ref >60.00)
Glucose, Bld: 313 mg/dL — ABNORMAL HIGH (ref 70–99)
Potassium: 4.6 meq/L (ref 3.5–5.1)
Sodium: 136 meq/L (ref 135–145)
Total Bilirubin: 1.2 mg/dL (ref 0.2–1.2)
Total Protein: 6.7 g/dL (ref 6.0–8.3)

## 2024-04-26 LAB — MICROALBUMIN / CREATININE URINE RATIO
Creatinine,U: 60.6 mg/dL
Microalb Creat Ratio: 270.8 mg/g — ABNORMAL HIGH (ref 0.0–30.0)
Microalb, Ur: 16.4 mg/dL — ABNORMAL HIGH (ref 0.0–1.9)

## 2024-04-26 LAB — LIPID PANEL
Cholesterol: 143 mg/dL (ref 0–200)
HDL: 81 mg/dL (ref >39.00)
LDL Cholesterol: 52 mg/dL (ref 0–99)
NonHDL: 61.77
Total CHOL/HDL Ratio: 2
Triglycerides: 50 mg/dL (ref 0.0–149.0)
VLDL: 10 mg/dL (ref 0.0–40.0)

## 2024-04-26 LAB — HEMOGLOBIN A1C: Hgb A1c MFr Bld: 12.3 % — ABNORMAL HIGH (ref 4.6–6.5)

## 2024-04-26 LAB — PSA: PSA: 0.14 ng/mL (ref 0.10–4.00)

## 2024-04-26 MED ORDER — RAMELTEON 8 MG PO TABS: 8.0000 mg | ORAL_TABLET | Freq: Every day | ORAL | 0 refills | Status: DC

## 2024-04-26 MED ORDER — AMITRIPTYLINE HCL 50 MG PO TABS: 50.0000 mg | ORAL_TABLET | Freq: Every day | ORAL | 0 refills | Status: DC

## 2024-04-26 MED ORDER — GABAPENTIN 300 MG PO CAPS: ORAL_CAPSULE | ORAL | 0 refills | Status: DC

## 2024-04-26 NOTE — Progress Notes (Signed)
 Patient ID: Eric Ortega, male    DOB: [birth year], 63 y.o.   MRN: [id number]   Assessment & Plan:  Type 2 diabetes mellitus with diabetic neuropathy, with long-term current use of insulin  (HCC) -     Microalbumin / creatinine urine ratio -     Hemoglobin A1c -     Lipid panel -     Comprehensive metabolic panel with GFR -     Amitriptyline  HCl; Take 1 tablet (50 mg total) by mouth at bedtime.  Dispense: 90 tablet; Refill: 0 -     Gabapentin ; TAKE 1 CAPSULE BY MOUTH IN THE MORNING AND  AT  NOON,  AND  TAKE  2  CAPSULES  AT  BEDTIME  Dispense: 120 capsule; Refill: 0 -     Ambulatory referral to Podiatry  Mixed hyperlipidemia -     Lipid panel  Screening for colon cancer -     Ambulatory referral to Gastroenterology  Prostate cancer screening -     PSA  Insomnia due to medical condition -     Ramelteon ; Take 1 tablet (8 mg total) by mouth at bedtime.  Dispense: 90 tablet; Refill: 0 -     Amitriptyline  HCl; Take 1 tablet (50 mg total) by mouth at bedtime.  Dispense: 90 tablet; Refill: 0  Anxiety and depression -     Gabapentin ; TAKE 1 CAPSULE BY MOUTH IN THE MORNING AND  AT  NOON,  AND  TAKE  2  CAPSULES  AT  BEDTIME  Dispense: 120 capsule; Refill: 0     Assessment & Plan Type 2 Diabetes Mellitus Type 2 diabetes mellitus with poor control. Previous A1c was 11.2, and recent ER visit showed blood glucose at 361 mg/dL. Currently on metformin  500 mg once daily and insulin  14 units twice daily. Reports dietary challenges due to denture issues, affecting dietary management. - Order CMP, A1c, and urine test for proteinuria - Continue metformin  500 mg once daily (any stronger dose caused diarrhea/ stomach upset) - Continue insulin  14 units in the morning and 14 units at night Lab Results  Component Value Date   HGBA1C 11.2 (A) 07/07/2023   HGBA1C 9.0 (H) 03/11/2023   HGBA1C 8.3 09/18/2022    Peripheral Neuropathy Chronic peripheral neuropathy with numbness and tingling in knees and  hands, likely related to diabetes. Currently on gabapentin  and amitriptyline . Not consistently taking the noon dose of gabapentin . - Continue gabapentin  300 mg in the morning and 600 mg at night - Continue amitriptyline  50 mg at bedtime - Refer to podiatry for foot care and callus management  Supraventricular Tachycardia (SVT) Recent episode of SVT requiring emergency intervention with adenosine . No recurrence since. History of tachycardia and previous ablations. Prescribed metoprolol  as needed, but not used. Follow-up with cardiology scheduled for September. - Follow up with cardiology in September  Chronic Thrombocytopenia Chronic thrombocytopenia with previous monitoring. Recent ER visit showed improved platelet counts. Regular monitoring of platelet levels is necessary. - Recheck labs to monitor platelet count  Chronic Anxiety and Depression Chronic anxiety and depression, exacerbated by recent family stressors. Managed with amitriptyline  and hydroxyzine . Reports significant stress due to family conflicts, impacting mental health. - Prescribe hydroxyzine  25 mg for anxiety management - Continue amitriptyline  50 mg at bedtime  Insomnia Chronic insomnia exacerbated by stress related to family issues. Previously on ramelteon  and amitriptyline , effective in the past. Reports intermittent sleep disturbances related to family stress. - Prescribe ramelteon  8 mg for sleep - Continue amitriptyline  50 mg  at bedtime  General Health Maintenance Due for routine screenings. Last colonoscopy was 15 years ago. No recent PSA check or eye exam. Acknowledges need for these screenings and plans to schedule them. - Refer to gastroenterology for colonoscopy - Order PSA test - Advise to schedule an eye exam  Follow-up Follow-up needed to monitor diabetes control, neuropathy, and overall health status. - Schedule follow-up appointment in 3 months      Return in about 3 months (around 07/27/2024) for  recheck/follow-up.    Subjective:    Chief Complaint  Patient presents with   Diabetes    Pt in office today for diabetes follow up; pt has concerns regarding his sleep and the neuropathy in his feet; would like referral for colonoscopy;     HPI Discussed the use of AI scribe software for clinical note transcription with the patient, who gave verbal consent to proceed.  History of Present Illness Eric Ortega is a 63 year old male with type 2 diabetes, insomnia, chronic anxiety, and depression who presents for a follow-up visit.  He experiences persistent numbness and tingling in his knees, hands, and feet. He has a history of gout but is not currently experiencing symptoms. He has not consulted a podiatrist for these issues. No stool changes or bleeding. Reports dull sensation in toes.  Approximately six to seven weeks ago, he had an episode of supraventricular tachycardia (SVT) with a heart rate of 160-170 bpm at rest. Emergency medical services were unable to stabilize his heart rate with three doses of medication, necessitating hospital intervention with a larger dose, which successfully reduced his heart rate. He has a history of tachycardia and underwent ablations 14-15 years ago. He was prescribed metoprolol  but has not needed it since the episode.  He is under significant stress due to family issues following his mother's death in 19-Oct-2024. His brother and sister have been difficult regarding the settlement of his mother's estate, leading to threats and altercations. This stress has impacted his sleep, with some nights being sleepless due to 'the mind just won't shut down.'  He has not been taking his medications consistently, including ramelteon  and amitriptyline , which he has been out of for two to three months. His sleep is erratic, with some nights being okay and others sleepless. His gabapentin  intake is inconsistent, often missing the noon dose.  He is currently taking  metformin  500 mg once daily and insulin  injections of 14 units in the morning and 14 units at night for his diabetes. He also takes atorvastatin  40 mg for cholesterol and atenolol  for blood pressure. He has a history of chronic thrombocytopenia.  He has dentures and difficulty eating certain foods, particularly meats, due to not having lower dentures. This has affected his nutrition as he tends to eat softer foods that he can manage.     Past Medical History:  Diagnosis Date   Diabetes mellitus without complication (HCC)    Hyperlipidemia    Hypertension     Past Surgical History:  Procedure Laterality Date   APPENDECTOMY      Family History  Problem Relation Age of Onset   Diabetes Mother    Heart failure Mother    Heart attack Mother    Stroke Mother    Heart failure Father     Social History   Tobacco Use   Smoking status: Former   Smokeless tobacco: Never  Vaping Use   Vaping status: Never Used  Substance Use Topics   Alcohol use: No  Drug use: No     No Known Allergies  Review of Systems NEGATIVE UNLESS OTHERWISE INDICATED IN HPI      Objective:     BP 128/70 (BP Location: Right Arm, Patient Position: Sitting, Cuff Size: Normal)   Pulse 71   Temp 98.2 F (36.8 C) (Temporal)   Ht 6' (1.829 m)   Wt 199 lb (90.3 kg)   SpO2 98%   BMI 26.99 kg/m   Wt Readings from Last 3 Encounters:  04/26/24 199 lb (90.3 kg)  03/02/24 213 lb (96.6 kg)  07/07/23 213 lb 12.8 oz (97 kg)    BP Readings from Last 3 Encounters:  04/26/24 128/70  03/02/24 (!) 162/88  07/07/23 134/74     Physical Exam Vitals and nursing note reviewed.  Constitutional:      Appearance: Normal appearance.  Eyes:     Extraocular Movements: Extraocular movements intact.     Conjunctiva/sclera: Conjunctivae normal.     Pupils: Pupils are equal, round, and reactive to light.  Cardiovascular:     Rate and Rhythm: Normal rate and regular rhythm.     Pulses: Normal pulses.           Dorsalis pedis pulses are 2+ on the right side and 2+ on the left side.       Posterior tibial pulses are 2+ on the right side and 2+ on the left side.  Pulmonary:     Effort: Pulmonary effort is normal.     Breath sounds: Normal breath sounds.  Musculoskeletal:        General: No swelling, tenderness or deformity.     Right lower leg: No edema.     Left lower leg: No edema.  Skin:    Findings: Lesion (numerous picked / scabs arms and legs) present.     Comments: Callouses bilateral feet Thickened toenails  Neurological:     Mental Status: He is alert.     Sensory: Sensory deficit (monofilament decreased bilateral feet) present.  Psychiatric:        Mood and Affect: Mood normal.             Seira Cody M Kyi Romanello, PA-C

## 2024-04-26 NOTE — Patient Instructions (Signed)
 Please schedule your AWV with Eric Ortega Please schedule 3 month f/up with me   Labs today

## 2024-04-28 ENCOUNTER — Ambulatory Visit: Payer: Self-pay | Admitting: Physician Assistant

## 2024-05-10 DIAGNOSIS — H5213 Myopia, bilateral: Secondary | ICD-10-CM | POA: Diagnosis not present

## 2024-05-19 ENCOUNTER — Other Ambulatory Visit: Payer: Self-pay | Admitting: Physician Assistant

## 2024-05-23 DIAGNOSIS — Z01818 Encounter for other preprocedural examination: Secondary | ICD-10-CM | POA: Diagnosis not present

## 2024-05-23 DIAGNOSIS — H2513 Age-related nuclear cataract, bilateral: Secondary | ICD-10-CM | POA: Diagnosis not present

## 2024-05-23 DIAGNOSIS — H25811 Combined forms of age-related cataract, right eye: Secondary | ICD-10-CM | POA: Diagnosis not present

## 2024-05-23 DIAGNOSIS — E113293 Type 2 diabetes mellitus with mild nonproliferative diabetic retinopathy without macular edema, bilateral: Secondary | ICD-10-CM | POA: Diagnosis not present

## 2024-06-01 DIAGNOSIS — I10 Essential (primary) hypertension: Secondary | ICD-10-CM | POA: Diagnosis not present

## 2024-06-01 DIAGNOSIS — F419 Anxiety disorder, unspecified: Secondary | ICD-10-CM | POA: Diagnosis not present

## 2024-06-01 DIAGNOSIS — E1136 Type 2 diabetes mellitus with diabetic cataract: Secondary | ICD-10-CM | POA: Diagnosis not present

## 2024-06-01 DIAGNOSIS — H25811 Combined forms of age-related cataract, right eye: Secondary | ICD-10-CM | POA: Diagnosis not present

## 2024-06-07 ENCOUNTER — Other Ambulatory Visit: Payer: Self-pay | Admitting: Physician Assistant

## 2024-06-07 DIAGNOSIS — F32A Depression, unspecified: Secondary | ICD-10-CM

## 2024-06-07 DIAGNOSIS — E1140 Type 2 diabetes mellitus with diabetic neuropathy, unspecified: Secondary | ICD-10-CM

## 2024-06-07 MED ORDER — GABAPENTIN 300 MG PO CAPS: ORAL_CAPSULE | ORAL | 2 refills | Status: DC

## 2024-06-07 NOTE — Telephone Encounter (Signed)
 Copied from CRM [id number]. Topic: Clinical - Medication Refill >> Jun 07, 2024 12:14 PM [place] J wrote: Medication: gabapentin  (NEURONTIN ) 300 MG capsule  Has the patient contacted their pharmacy? Yes (Agent: If no, request that the patient contact the pharmacy for the refill. If patient does not wish to contact the pharmacy document the reason why and proceed with request.) (Agent: If yes, when and what did the pharmacy advise?) Pharmacy stated that there are no refills on medication.  This is the patient's preferred pharmacy:  [facility] [address] [place] - [address] [place] [address] Phone: [phone] Fax: [phone]  Is this the correct pharmacy for this prescription? Yes If no, delete pharmacy and type the correct one.   Has the prescription been filled recently? No  Is the patient out of the medication? Yes  Has the patient been seen for an appointment in the last year OR does the patient have an upcoming appointment? Yes  Can we respond through MyChart? No  Agent: Please be advised that Rx refills may take up to 3 business days. We ask that you follow-up with your pharmacy.

## 2024-06-07 NOTE — Telephone Encounter (Signed)
 Last Visit: 04/26/24  Next Visit: none  Last Filled: 04/26/24  Quantity: 120

## 2024-06-10 ENCOUNTER — Telehealth: Payer: Self-pay

## 2024-06-10 NOTE — Telephone Encounter (Signed)
 Called pt and lvm with cb number; advised overdue for OV with PCP; please schedule appt with PCP for diabetes follow up and A1C around 07/27/24 if pt returns call.

## 2024-06-20 ENCOUNTER — Ambulatory Visit: Attending: Cardiology | Admitting: Cardiology

## 2024-06-21 ENCOUNTER — Encounter: Payer: Self-pay | Admitting: Cardiology

## 2024-06-30 ENCOUNTER — Other Ambulatory Visit: Payer: Self-pay | Admitting: Physician Assistant

## 2024-07-11 NOTE — Telephone Encounter (Unsigned)
 Copied from CRM [id number]. Topic: Clinical - Medication Refill >> Jul 11, 2024  1:44 PM Harlene ORN wrote: Medication: atorvastatin  (LIPITOR) 40 MG tablet, hydrOXYzine  (ATARAX ) 50 MG tablet  Has the patient contacted their pharmacy? Yes (Agent: If no, request that the patient contact the pharmacy for the refill. If patient does not wish to contact the pharmacy document the reason why and proceed with request.) (Agent: If yes, when and what did the pharmacy advise?)  This is the patient's preferred pharmacy:  [facility] [address] [place] - [address] [employer] [place] [place] [address] Phone: [phone] Fax: [phone]  Is this the correct pharmacy for this prescription? Yes If no, delete pharmacy and type the correct one.   Has the prescription been filled recently? Yes  Is the patient out of the medication? Yes  Has the patient been seen for an appointment in the last year OR does the patient have an upcoming appointment? Yes  Can we respond through MyChart? No  Agent: Please be advised that Rx refills may take up to 3 business days. We ask that you follow-up with your pharmacy.

## 2024-07-12 ENCOUNTER — Encounter: Payer: Self-pay | Admitting: Physician Assistant

## 2024-07-18 ENCOUNTER — Other Ambulatory Visit: Payer: Self-pay | Admitting: Physician Assistant

## 2024-07-18 DIAGNOSIS — F32A Depression, unspecified: Secondary | ICD-10-CM

## 2024-07-18 DIAGNOSIS — G4701 Insomnia due to medical condition: Secondary | ICD-10-CM

## 2024-07-18 DIAGNOSIS — E1140 Type 2 diabetes mellitus with diabetic neuropathy, unspecified: Secondary | ICD-10-CM

## 2024-07-18 NOTE — Telephone Encounter (Unsigned)
 Copied from CRM [id number]. Topic: Clinical - Medication Refill >> Jul 18, 2024  2:41 PM Dedra B wrote: Medication: gabapentin  (NEURONTIN ) 300 MG capsule amitriptyline  (ELAVIL ) 50 MG tablet ramelteon  (ROZEREM ) 8 MG tablet hydrOXYzine  (ATARAX ) 50 MG tablet  Has the patient contacted their pharmacy? Yes, pharmacy told him to call office  This is the patient's preferred pharmacy:  [facility] [address] [place] - [address] [place] [address] Phone: [phone] Fax: [phone]  Is this the correct pharmacy for this prescription? Yes  Has the prescription been filled recently? No  Is the patient out of the medication? Yes  Has the patient been seen for an appointment in the last year OR does the patient have an upcoming appointment? Yes  Can we respond through MyChart? Yes  Agent: Please be advised that Rx refills may take up to 3 business days. We ask that you follow-up with your pharmacy.

## 2024-07-19 MED ORDER — AMITRIPTYLINE HCL 50 MG PO TABS: 50.0000 mg | ORAL_TABLET | Freq: Every day | ORAL | 0 refills | Status: DC

## 2024-07-19 MED ORDER — HYDROXYZINE HCL 50 MG PO TABS: 100.0000 mg | ORAL_TABLET | Freq: Every evening | ORAL | 1 refills | Status: AC | PRN

## 2024-07-19 MED ORDER — RAMELTEON 8 MG PO TABS: 8.0000 mg | ORAL_TABLET | Freq: Every day | ORAL | 0 refills | Status: DC

## 2024-08-25 DIAGNOSIS — B351 Tinea unguium: Secondary | ICD-10-CM | POA: Diagnosis not present

## 2024-08-25 DIAGNOSIS — E11628 Type 2 diabetes mellitus with other skin complications: Secondary | ICD-10-CM | POA: Diagnosis not present

## 2024-08-25 DIAGNOSIS — L089 Local infection of the skin and subcutaneous tissue, unspecified: Secondary | ICD-10-CM | POA: Diagnosis not present

## 2024-08-25 DIAGNOSIS — L03031 Cellulitis of right toe: Secondary | ICD-10-CM | POA: Diagnosis not present

## 2024-08-25 DIAGNOSIS — L609 Nail disorder, unspecified: Secondary | ICD-10-CM | POA: Diagnosis not present

## 2024-09-15 ENCOUNTER — Other Ambulatory Visit: Payer: Self-pay | Admitting: Physician Assistant

## 2024-09-15 DIAGNOSIS — G4701 Insomnia due to medical condition: Secondary | ICD-10-CM

## 2024-09-15 MED ORDER — RAMELTEON 8 MG PO TABS: 8.0000 mg | ORAL_TABLET | Freq: Every day | ORAL | 0 refills | Status: AC

## 2024-09-15 NOTE — Telephone Encounter (Signed)
 Copied from CRM [id number]. Topic: Clinical - Medication Refill >> Sep 15, 2024 10:42 AM Laymon HERO wrote: Medication: ramelteon  (ROZEREM ) 8 MG tablet  Has the patient contacted their pharmacy? Yes (Agent: If no, request that the patient contact the pharmacy for the refill. If patient does not wish to contact the pharmacy document the reason why and proceed with request.) (Agent: If yes, when and what did the pharmacy advise?)  This is the patient's preferred pharmacy:  [facility] [address], [place] - [address] [place] [address] Phone: [phone] Fax: [phone]  Is this the correct pharmacy for this prescription? Yes If no, delete pharmacy and type the correct one.   Has the prescription been filled recently? Yes  Is the patient out of the medication? Yes  Has the patient been seen for an appointment in the last year OR does the patient have an upcoming appointment? Yes  Can we respond through MyChart? Yes  Agent: Please be advised that Rx refills may take up to 3 business days. We ask that you follow-up with your pharmacy.

## 2024-09-16 ENCOUNTER — Ambulatory Visit: Admitting: Physician Assistant

## 2024-09-21 ENCOUNTER — Encounter: Payer: Self-pay | Admitting: Physician Assistant

## 2024-10-12 ENCOUNTER — Other Ambulatory Visit: Payer: Self-pay | Admitting: Physician Assistant

## 2024-10-12 DIAGNOSIS — F419 Anxiety disorder, unspecified: Secondary | ICD-10-CM

## 2024-10-12 DIAGNOSIS — E1140 Type 2 diabetes mellitus with diabetic neuropathy, unspecified: Secondary | ICD-10-CM

## 2024-10-12 NOTE — Telephone Encounter (Signed)
 Copied from CRM #[id number]. Topic: Clinical - Medication Refill >> Oct 12, 2024  4:07 PM Burnard DEL wrote: Medication: gabapentin  (NEURONTIN ) 300 MG capsule atenolol  (TENORMIN ) 25 MG tablet  Has the patient contacted their pharmacy? Yes (Agent: If no, request that the patient contact the pharmacy for the refill. If patient does not wish to contact the pharmacy document the reason why and proceed with request.) (Agent: If yes, when and what did the pharmacy advise?)  This is the patient's preferred pharmacy:  [facility] [address] [place] - [address]  Phone: [phone] Fax: [phone]     Is this the correct pharmacy for this prescription? Yes If no, delete pharmacy and type the correct one.   Has the prescription been filled recently? No  Is the patient out of the medication? Yes  Has the patient been seen for an appointment in the last year OR does the patient have an upcoming appointment? Yes  Can we respond through MyChart? Yes  Agent: Please be advised that Rx refills may take up to 3 business days. We ask that you follow-up with your pharmacy.

## 2024-10-13 ENCOUNTER — Other Ambulatory Visit: Payer: Self-pay | Admitting: Physician Assistant

## 2024-10-13 DIAGNOSIS — E1140 Type 2 diabetes mellitus with diabetic neuropathy, unspecified: Secondary | ICD-10-CM

## 2024-10-13 DIAGNOSIS — F419 Anxiety disorder, unspecified: Secondary | ICD-10-CM

## 2024-10-18 ENCOUNTER — Ambulatory Visit: Admitting: Family Medicine

## 2024-10-19 ENCOUNTER — Encounter: Payer: Self-pay | Admitting: Physician Assistant

## 2024-10-19 ENCOUNTER — Ambulatory Visit: Admitting: Physician Assistant

## 2024-10-19 VITALS — BP 162/84 | HR 82 | Temp 98.2°F | Ht 72.0 in | Wt 201.0 lb

## 2024-10-19 DIAGNOSIS — E782 Mixed hyperlipidemia: Secondary | ICD-10-CM

## 2024-10-19 DIAGNOSIS — M7918 Myalgia, other site: Secondary | ICD-10-CM

## 2024-10-19 DIAGNOSIS — Z794 Long term (current) use of insulin: Secondary | ICD-10-CM | POA: Diagnosis not present

## 2024-10-19 DIAGNOSIS — Z1211 Encounter for screening for malignant neoplasm of colon: Secondary | ICD-10-CM | POA: Diagnosis not present

## 2024-10-19 DIAGNOSIS — I10 Essential (primary) hypertension: Secondary | ICD-10-CM | POA: Diagnosis not present

## 2024-10-19 DIAGNOSIS — E1142 Type 2 diabetes mellitus with diabetic polyneuropathy: Secondary | ICD-10-CM | POA: Diagnosis not present

## 2024-10-19 DIAGNOSIS — F419 Anxiety disorder, unspecified: Secondary | ICD-10-CM

## 2024-10-19 DIAGNOSIS — F32A Depression, unspecified: Secondary | ICD-10-CM

## 2024-10-19 DIAGNOSIS — R633 Feeding difficulties, unspecified: Secondary | ICD-10-CM

## 2024-10-19 DIAGNOSIS — D693 Immune thrombocytopenic purpura: Secondary | ICD-10-CM | POA: Diagnosis not present

## 2024-10-19 DIAGNOSIS — E1140 Type 2 diabetes mellitus with diabetic neuropathy, unspecified: Secondary | ICD-10-CM

## 2024-10-19 LAB — MICROALBUMIN / CREATININE URINE RATIO
Creatinine,U: 52.3 mg/dL
Microalb Creat Ratio: 923 mg/g — ABNORMAL HIGH (ref 0.0–30.0)
Microalb, Ur: 48.3 mg/dL — ABNORMAL HIGH (ref 0.7–1.9)

## 2024-10-19 LAB — VITAMIN B12: Vitamin B-12: 531 pg/mL (ref 211–911)

## 2024-10-19 LAB — CBC WITH DIFFERENTIAL/PLATELET
Basophils Absolute: 0 K/uL (ref 0.0–0.1)
Basophils Relative: 0.7 % (ref 0.0–3.0)
Eosinophils Absolute: 0.3 K/uL (ref 0.0–0.7)
Eosinophils Relative: 7.1 % — ABNORMAL HIGH (ref 0.0–5.0)
HCT: 40.6 % (ref 39.0–52.0)
Hemoglobin: 14.1 g/dL (ref 13.0–17.0)
Lymphocytes Relative: 23.2 % (ref 12.0–46.0)
Lymphs Abs: 0.9 K/uL (ref 0.7–4.0)
MCHC: 34.9 g/dL (ref 30.0–36.0)
MCV: 80.7 fl (ref 78.0–100.0)
Monocytes Absolute: 0.5 K/uL (ref 0.1–1.0)
Monocytes Relative: 12.2 % — ABNORMAL HIGH (ref 3.0–12.0)
Neutro Abs: 2.2 K/uL (ref 1.4–7.7)
Neutrophils Relative %: 56.8 % (ref 43.0–77.0)
Platelets: 84 K/uL — ABNORMAL LOW (ref 150.0–400.0)
RBC: 5.03 Mil/uL (ref 4.22–5.81)
RDW: 16.7 % — ABNORMAL HIGH (ref 11.5–15.5)
WBC: 3.9 K/uL — ABNORMAL LOW (ref 4.0–10.5)

## 2024-10-19 LAB — COMPREHENSIVE METABOLIC PANEL WITH GFR
ALT: 27 U/L (ref 3–53)
AST: 36 U/L (ref 5–37)
Albumin: 3.8 g/dL (ref 3.5–5.2)
Alkaline Phosphatase: 151 U/L — ABNORMAL HIGH (ref 39–117)
BUN: 11 mg/dL (ref 6–23)
CO2: 25 meq/L (ref 19–32)
Calcium: 8.9 mg/dL (ref 8.4–10.5)
Chloride: 103 meq/L (ref 96–112)
Creatinine, Ser: 0.7 mg/dL (ref 0.40–1.50)
GFR: 96.46 mL/min
Glucose, Bld: 274 mg/dL — ABNORMAL HIGH (ref 70–99)
Potassium: 4.1 meq/L (ref 3.5–5.1)
Sodium: 135 meq/L (ref 135–145)
Total Bilirubin: 1 mg/dL (ref 0.2–1.2)
Total Protein: 6.8 g/dL (ref 6.0–8.3)

## 2024-10-19 LAB — HEMOGLOBIN A1C: Hgb A1c MFr Bld: 10.5 % — ABNORMAL HIGH (ref 4.6–6.5)

## 2024-10-19 MED ORDER — ATORVASTATIN CALCIUM 40 MG PO TABS: 40.0000 mg | ORAL_TABLET | Freq: Every day | ORAL | 0 refills | Status: AC

## 2024-10-19 MED ORDER — ATENOLOL 25 MG PO TABS: 25.0000 mg | ORAL_TABLET | Freq: Every day | ORAL | 0 refills | Status: AC

## 2024-10-19 MED ORDER — NOVOLOG 70/30 FLEXPEN RELION (70-30) 100 UNIT/ML ~~LOC~~ SUPN: PEN_INJECTOR | SUBCUTANEOUS | 5 refills | Status: DC

## 2024-10-19 MED ORDER — METFORMIN HCL 500 MG PO TABS: 500.0000 mg | ORAL_TABLET | Freq: Two times a day (BID) | ORAL | 0 refills | Status: AC

## 2024-10-19 MED ORDER — GABAPENTIN 300 MG PO CAPS: ORAL_CAPSULE | ORAL | 2 refills | Status: AC

## 2024-10-19 NOTE — Patient Instructions (Signed)
 PLEASE SCHEDULE A 3 MONTH FOLLOW-UP VISIT.  IT IS IMPORTANT TO KEEP YOUR APPOINTMENTS.   VISIT SUMMARY: You are a 64 year old patient with poorly controlled diabetes and diabetic neuropathy. We discussed your pain management, diabetes control, and dietary challenges due to dentures. We also addressed your hypertension, hyperlipidemia, muscle pain, and general health maintenance.  YOUR PLAN: TYPE 2 DIABETES MELLITUS WITH DIABETIC POLYNEUROPATHY: Your diabetes is not well controlled, which is causing significant pain in your feet and hands. -We checked your B12 levels to see if a deficiency is contributing to your neuropathy. -We discussed dietary changes to help manage your blood sugar, considering your denture-related challenges. -You are referred to an endocrinologist for further diabetes management. -Your insulin  and metformin  prescriptions have been refilled. -Use online resources for meal planning that consider your denture limitations.  ESSENTIAL HYPERTENSION: Your blood pressure was elevated during the visit. -Continue taking atenolol  25 mg at bedtime.  HYPERLIPIDEMIA: You are currently on atorvastatin  but may have missed doses due to misplacing your medication during a move. -Your atorvastatin  prescription has been refilled.  MYALGIA OF SHOULDER AND UPPER BACK: You have muscle knots in your shoulders and upper back, likely due to stress and tension. -Consider physical therapy or massage therapy for muscle tension. -Use heat therapy to relax your muscles. -We checked your electrolytes to rule out imbalances contributing to muscle cramping.  EDENTULISM WITH DENTURE-RELATED DIETARY DIFFICULTY: Your dentures limit your ability to eat certain foods, affecting your diet and blood sugar control. -Explore alternative food options and meal planning strategies to accommodate your denture limitations. -Consider seeing a specialist for potential dental implants to improve your dietary  options.  GENERAL HEALTH MAINTENANCE: You are due for a colonoscopy. -A colonoscopy has been scheduled at [facility] in [employer].                      Contains text generated by Abridge.                                 Contains text generated by Abridge.

## 2024-10-19 NOTE — Progress Notes (Signed)
 "   Patient ID: Eric Ortega, male    DOB: 04-03-1961, 64 y.o.   MRN: [id number]   Assessment & Plan:  Type 2 diabetes mellitus with diabetic neuropathy, with long-term current use of insulin  (HCC) -     Comprehensive metabolic panel with GFR -     Hemoglobin A1c -     Microalbumin / creatinine urine ratio -     Vitamin B12 -     Gabapentin ; TAKE 1 CAPSULE BY MOUTH IN THE MORNING AND  AT  NOON,  AND  TAKE  2  CAPSULES  AT  BEDTIME  Dispense: 120 capsule; Refill: 2 -     NovoLOG  70/30 FlexPen ReliOn; Inject 14 units subcutaneous qAM. Inject 14 units [place] qPM now, increase by 2 units every 2 days until AM sugar fasting is 140 or less. Max PM insulin  dose is 60 units.  Dispense: 15 mL; Refill: 5  Screening for colon cancer -     Ambulatory referral to Gastroenterology  Anxiety and depression -     Gabapentin ; TAKE 1 CAPSULE BY MOUTH IN THE MORNING AND  AT  NOON,  AND  TAKE  2  CAPSULES  AT  BEDTIME  Dispense: 120 capsule; Refill: 2  Mixed hyperlipidemia -     Atorvastatin  Calcium ; Take 1 tablet (40 mg total) by mouth daily.  Dispense: 90 tablet; Refill: 0  Chronic idiopathic thrombocytopenia (HCC) -     CBC with Differential/Platelet  Other orders -     Atenolol ; Take 1 tablet (25 mg total) by mouth at bedtime.  Dispense: 90 tablet; Refill: 0 -     metFORMIN  HCl; Take 1 tablet (500 mg total) by mouth 2 (two) times daily with a meal.  Dispense: 360 tablet; Refill: 0      Assessment and Plan Assessment & Plan Type 2 diabetes mellitus complicated by diabetic polyneuropathy Chronic peripheral neuropathy likely related to poorly controlled diabetes. A1c was 12.3 in July, indicating poor glycemic control. Neuropathy symptoms include significant pain in feet and hands. Recent issues with insulin  administration due to lack of needles and pens. Dietary challenges due to dentures affecting food choices and glycemic control. - Checked B12 levels to assess for deficiency contributing to  neuropathy. - Encouraged dietary modifications to improve glycemic control, considering denture-related dietary challenges. - Referred to endocrinology for further management of diabetes. - Refilled insulin  and metformin  prescriptions. - Encouraged use of online resources for meal planning with denture-related dietary considerations. Lab Results  Component Value Date   HGBA1C 12.3 (H) 04/26/2024   HGBA1C 11.2 (A) 07/07/2023   HGBA1C 9.0 (H) 03/11/2023    Essential hypertension Blood pressure was elevated during the visit. Currently on atenolol  25 mg at bedtime. - Rechecked blood pressure during the visit. - Continue atenolol  25 mg at bedtime.  Hyperlipidemia Currently on atorvastatin  (Lipitor). Uncertain about recent adherence due to medication misplacement during a move. - Refilled atorvastatin  prescription.  Myalgia of shoulder and upper back Reports muscle knots in shoulders and upper back, likely related to stress and tension. Symptoms have been present for the last two to three weeks. Pain is exacerbated by stress and lack of sleep. - Recommended physical therapy or massage therapy for muscle tension. - Advised use of heat therapy to relax muscles. - Checked electrolytes to rule out imbalances contributing to muscle cramping.  Edentulism with denture-related dietary difficulty Dentures limit ability to eat certain foods, affecting dietary choices and glycemic control. Plans to see a specialist for potential  dental implants. - Encouraged exploration of alternative food options and meal planning strategies to accommodate denture limitations. - Supported decision to pursue dental implants for improved dietary options.  General health maintenance Due for a colonoscopy, last performed 12-14 years ago. - Referred for colonoscopy at [facility] in [place].      Return in about 3 months (around 01/17/2025) for recheck/follow-up.    Subjective:    Chief Complaint   Patient presents with   Diabetes    Pt in office for diabetes follow up; pt c/o Neuropathy getting worse, now present in hands per patient.     HPI Discussed the use of AI scribe software for clinical note transcription with the patient, who gave verbal consent to proceed.  History of Present Illness The patient is a 64 year old with poorly controlled diabetes who presents for a follow-up visit regarding diabetic neuropathy.   He experiences significant pain in his feet and hands due to diabetic neuropathy. He has been taking gabapentin  300 mg in the morning and 600 mg at night, along with amitriptyline  50 mg at bedtime since July 2025, but continues to experience discomfort.  His diabetes management has been challenging, with an A1c of 12.3% noted in July 2025, indicating poor control. He takes metformin  and 70/30 insulin  but has not had insulin  shots for about two weeks due to running out of supplies. He reports difficulty managing his diet due to dental issues, as he has dentures but no bottom ones, making it hard to eat certain foods.  His diet primarily consists of beans, chicken, fish, and broccoli, with limited red meat and salads. He takes a multivitamin and turmeric supplements.  He reports muscle knots in his shoulders and upper back, which are painful, particularly at night. He has tried ibuprofen and patches with minimal relief.  His brother was recently diagnosed with cirrhosis of the liver due to long-term medication use and possibly alcohol.  He recently moved and changed his pharmacy to [facility] in [place]. He has been dealing with the stress of selling his mother's house and family issues. He works part-time and experiences a slowdown in business during the winter months.     Past Medical History:  Diagnosis Date   Diabetes mellitus without complication (HCC)    Hyperlipidemia    Hypertension     Past Surgical History:  Procedure Laterality Date    APPENDECTOMY      Family History  Problem Relation Age of Onset   Diabetes Mother    Heart failure Mother    Heart attack Mother    Stroke Mother    Heart failure Father    Cirrhosis Brother    Drug abuse Brother     Social History[1]   Allergies[2]  Review of Systems NEGATIVE UNLESS OTHERWISE INDICATED IN HPI      Objective:     BP (!) 142/82 (BP Location: Left Arm, Patient Position: Sitting, Cuff Size: Normal)   Pulse 82   Temp 98.2 F (36.8 C) (Temporal)   Ht 6' (1.829 m)   Wt 201 lb (91.2 kg)   SpO2 99%   BMI 27.26 kg/m   Wt Readings from Last 3 Encounters:  10/19/24 201 lb (91.2 kg)  04/26/24 199 lb (90.3 kg)  03/02/24 213 lb (96.6 kg)    BP Readings from Last 3 Encounters:  10/19/24 (!) 142/82  04/26/24 128/70  03/02/24 (!) 162/88     Physical Exam Vitals and nursing note reviewed.  Constitutional:  General: He is not in acute distress.    Appearance: Normal appearance. He is not ill-appearing.  HENT:     Head: Normocephalic.     Right Ear: External ear normal.     Left Ear: External ear normal.  Eyes:     Extraocular Movements: Extraocular movements intact.     Conjunctiva/sclera: Conjunctivae normal.     Pupils: Pupils are equal, round, and reactive to light.  Cardiovascular:     Rate and Rhythm: Normal rate and regular rhythm.     Pulses: Normal pulses.     Heart sounds: Normal heart sounds. No murmur heard. Pulmonary:     Effort: Pulmonary effort is normal. No respiratory distress.     Breath sounds: Normal breath sounds. No wheezing.  Musculoskeletal:        General: Tenderness (bilateral shoulders and upper back, tense muscles noted) present.     Cervical back: Normal range of motion.  Skin:    General: Skin is warm.  Neurological:     Mental Status: He is alert and oriented to person, place, and time.  Psychiatric:        Mood and Affect: Mood normal.        Behavior: Behavior normal.             Hindy Perrault M  Madisson Kulaga, PA-C     [1]  Social History Tobacco Use   Smoking status: Former   Smokeless tobacco: Never  Vaping Use   Vaping status: Never Used  Substance Use Topics   Alcohol use: No   Drug use: No  [2] No Known Allergies  "

## 2024-10-21 ENCOUNTER — Ambulatory Visit: Payer: Self-pay | Admitting: Physician Assistant

## 2024-10-22 ENCOUNTER — Other Ambulatory Visit: Payer: Self-pay | Admitting: Physician Assistant

## 2024-10-22 DIAGNOSIS — G4701 Insomnia due to medical condition: Secondary | ICD-10-CM

## 2024-10-22 DIAGNOSIS — E1140 Type 2 diabetes mellitus with diabetic neuropathy, unspecified: Secondary | ICD-10-CM

## 2024-10-24 ENCOUNTER — Other Ambulatory Visit (HOSPITAL_COMMUNITY): Payer: Self-pay

## 2024-10-24 ENCOUNTER — Telehealth: Payer: Self-pay | Admitting: Physician Assistant

## 2024-10-24 NOTE — Telephone Encounter (Signed)
 Error

## 2024-10-24 NOTE — Telephone Encounter (Signed)
 Called pt to sche, pt disclosed they will need to look at their schedule, and will call back. Transfer to front office, please. aw

## 2024-10-24 NOTE — Progress Notes (Signed)
 Also tried calling patient, unable to leave VM.  Sent to admin to try and make appointment.

## 2024-10-28 NOTE — Progress Notes (Signed)
 Spoke with Son, Explained that we needed to get Dad to return call to make a follow-up appointment to review labs and next steps.

## 2024-11-10 ENCOUNTER — Encounter: Payer: Self-pay | Admitting: Physician Assistant

## 2024-11-10 ENCOUNTER — Ambulatory Visit: Admitting: Physician Assistant

## 2024-11-10 VITALS — BP 136/78 | HR 70 | Temp 97.3°F | Ht 72.0 in | Wt 197.4 lb

## 2024-11-10 DIAGNOSIS — D693 Immune thrombocytopenic purpura: Secondary | ICD-10-CM

## 2024-11-10 DIAGNOSIS — R7989 Other specified abnormal findings of blood chemistry: Secondary | ICD-10-CM

## 2024-11-10 DIAGNOSIS — R932 Abnormal findings on diagnostic imaging of liver and biliary tract: Secondary | ICD-10-CM

## 2024-11-10 DIAGNOSIS — R809 Proteinuria, unspecified: Secondary | ICD-10-CM

## 2024-11-10 DIAGNOSIS — R1084 Generalized abdominal pain: Secondary | ICD-10-CM

## 2024-11-10 DIAGNOSIS — R748 Abnormal levels of other serum enzymes: Secondary | ICD-10-CM

## 2024-11-10 DIAGNOSIS — I10 Essential (primary) hypertension: Secondary | ICD-10-CM

## 2024-11-10 DIAGNOSIS — Z8379 Family history of other diseases of the digestive system: Secondary | ICD-10-CM

## 2024-11-10 DIAGNOSIS — E1140 Type 2 diabetes mellitus with diabetic neuropathy, unspecified: Secondary | ICD-10-CM

## 2024-11-10 DIAGNOSIS — R112 Nausea with vomiting, unspecified: Secondary | ICD-10-CM

## 2024-11-10 DIAGNOSIS — F419 Anxiety disorder, unspecified: Secondary | ICD-10-CM

## 2024-11-10 LAB — COMPREHENSIVE METABOLIC PANEL WITH GFR
ALT: 34 U/L (ref 3–53)
AST: 33 U/L (ref 5–37)
Albumin: 3.9 g/dL (ref 3.5–5.2)
Alkaline Phosphatase: 148 U/L — ABNORMAL HIGH (ref 39–117)
BUN: 10 mg/dL (ref 6–23)
CO2: 30 meq/L (ref 19–32)
Calcium: 9.2 mg/dL (ref 8.4–10.5)
Chloride: 103 meq/L (ref 96–112)
Creatinine, Ser: 0.87 mg/dL (ref 0.40–1.50)
GFR: 90.29 mL/min
Glucose, Bld: 268 mg/dL — ABNORMAL HIGH (ref 70–99)
Potassium: 5.1 meq/L (ref 3.5–5.1)
Sodium: 136 meq/L (ref 135–145)
Total Bilirubin: 1.6 mg/dL — ABNORMAL HIGH (ref 0.2–1.2)
Total Protein: 6.8 g/dL (ref 6.0–8.3)

## 2024-11-10 LAB — CBC WITH DIFFERENTIAL/PLATELET
Basophils Absolute: 0 10*3/uL (ref 0.0–0.1)
Basophils Relative: 0.7 % (ref 0.0–3.0)
Eosinophils Absolute: 0.2 10*3/uL (ref 0.0–0.7)
Eosinophils Relative: 3.8 % (ref 0.0–5.0)
HCT: 45.1 % (ref 39.0–52.0)
Hemoglobin: 15.8 g/dL (ref 13.0–17.0)
Lymphocytes Relative: 18.9 % (ref 12.0–46.0)
Lymphs Abs: 1.1 10*3/uL (ref 0.7–4.0)
MCHC: 35 g/dL (ref 30.0–36.0)
MCV: 81.6 fl (ref 78.0–100.0)
Monocytes Absolute: 0.6 10*3/uL (ref 0.1–1.0)
Monocytes Relative: 10.1 % (ref 3.0–12.0)
Neutro Abs: 3.7 10*3/uL (ref 1.4–7.7)
Neutrophils Relative %: 66.5 % (ref 43.0–77.0)
Platelets: 97 10*3/uL — ABNORMAL LOW (ref 150.0–400.0)
RBC: 5.53 Mil/uL (ref 4.22–5.81)
RDW: 16.3 % — ABNORMAL HIGH (ref 11.5–15.5)
WBC: 5.6 10*3/uL (ref 4.0–10.5)

## 2024-11-10 LAB — GAMMA GT: GGT: 175 U/L — ABNORMAL HIGH (ref 7–51)

## 2024-11-10 LAB — PROTIME-INR
INR: 1.2 ratio — ABNORMAL HIGH (ref 0.8–1.0)
Prothrombin Time: 13 s (ref 9.6–13.1)

## 2024-11-10 LAB — VITAMIN D 25 HYDROXY (VIT D DEFICIENCY, FRACTURES): VITD: 27.13 ng/mL — ABNORMAL LOW (ref 30.00–100.00)

## 2024-11-10 LAB — C-REACTIVE PROTEIN: CRP: <0.5 mg/dL — ABNORMAL LOW (ref 1.0–20.0)

## 2024-11-10 LAB — SEDIMENTATION RATE: Sed Rate: 3 mm/h (ref 0–20)

## 2024-11-10 MED ORDER — PANTOPRAZOLE SODIUM 20 MG PO TBEC: 20.0000 mg | DELAYED_RELEASE_TABLET | Freq: Every morning | ORAL | 0 refills | Status: AC

## 2024-11-10 MED ORDER — LOSARTAN POTASSIUM 25 MG PO TABS: 25.0000 mg | ORAL_TABLET | Freq: Every day | ORAL | 2 refills | Status: AC

## 2024-11-10 MED ORDER — OZEMPIC (0.25 OR 0.5 MG/DOSE) 2 MG/3ML ~~LOC~~ SOPN: PEN_INJECTOR | SUBCUTANEOUS | 0 refills | Status: DC

## 2024-11-10 MED ORDER — ONDANSETRON 4 MG PO TBDP: 4.0000 mg | ORAL_TABLET | Freq: Three times a day (TID) | ORAL | 0 refills | Status: AC | PRN

## 2024-11-10 NOTE — Progress Notes (Signed)
 "   Patient ID: Eric Ortega, male    DOB: 11/26/1960, 64 y.o.   MRN: [id number]   Assessment & Plan:  Type 2 diabetes mellitus with diabetic neuropathy, with long-term current use of insulin  (HCC) -     Ozempic  (0.25 or 0.5 MG/DOSE); Inject 0.25 mg into the skin once a week for 28 days, THEN 0.5 mg once a week for 28 days.  Dispense: 6 mL; Refill: 0  Primary hypertension  Elevated alkaline phosphatase level -     VITAMIN D  25 Hydroxy (Vit-D Deficiency, Fractures) -     Gamma GT -     Comprehensive metabolic panel with GFR -     Sedimentation rate -     C-reactive protein  Abnormal CBC -     CBC with Differential/Platelet -     Pathologist smear review -     Sedimentation rate -     C-reactive protein -     Protime-INR -     Peripheral Blood Smear Review; Future  Chronic idiopathic thrombocytopenia (HCC) -     CBC with Differential/Platelet -     Pathologist smear review -     Peripheral Blood Smear Review; Future  Microalbuminuria  Nausea and vomiting, unspecified vomiting type  Abnormal finding on imaging of liver -     Protime-INR -     US  Abdomen Complete; Future  Family history of hepatic cirrhosis -     Protime-INR -     US  Abdomen Complete; Future  Diffuse abdominal pain -     Protime-INR -     US  Abdomen Complete; Future  Anxiety and depression  Other orders -     Losartan  Potassium; Take 1 tablet (25 mg total) by mouth daily. Take for blood pressure and kidney protection.  Dispense: 30 tablet; Refill: 2 -     Ondansetron ; Take 1 tablet (4 mg total) by mouth every 8 (eight) hours as needed for nausea or vomiting.  Dispense: 20 tablet; Refill: 0 -     Pantoprazole  Sodium; Take 1 tablet (20 mg total) by mouth in the morning. Take for acid reflux.  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 Microalbuminuria suggests diabetic nephropathy. Insulin  cost increase led to discontinuation for four weeks. Considering GLP-1 receptor agonist (Ozempic ) to reduce insulin  dependency. No history of pancreatitis, gallbladder disease, or medullary thyroid  cancer. No heart disease. Discussed potential benefits of GLP-1 receptor agonists in reducing insulin  need and improving glucose control. - Prescribed Ozempic  pending insurance approval - Continue Metformin  500 mg oral bid - Prescribed losartan  for kidney protection - Monitor blood glucose levels - Will consider endocrinology referral for diabetes management  Chronic idiopathic thrombocytopenia with leukopenia Chronic low platelet count with recent decrease to 84. White blood cell count also decreased. Previous hematology evaluation indicated chronic condition. Plan to repeat CBC and perform pathologic smear review to assess cell structure. - Ordered repeat CBC - Ordered pathologic smear review  Abnormal liver enzymes and abnormal liver imaging Elevated alkaline phosphatase at 151. Differential includes vitamin D  deficiency, liver involvement, or cancer marker. Family history of liver cirrhosis. Previous imaging suggested liver changes consistent with cirrhosis. Plan to evaluate liver function and vitamin D  levels. Discussed potential liver ultrasound to assess for cirrhosis. - Ordered vitamin D  level - Ordered GGT test -  Ordered liver ultrasound - Ordered inflammatory markers (sed rate and CRP)  Essential hypertension Blood pressure management may aid in kidney protection. Current regimen includes atenolol . Plan to add losartan  for additional kidney protection and blood pressure control. - Prescribed losartan  for kidney protection and blood pressure control  Nausea and vomiting Recent episode of nausea and vomiting possibly related to dietary intake of tomatoes. No significant stomach pain or other symptoms. Discussed  potential dietary triggers and management options. - Prescribed Zofran  for nausea - Prescribed acid blocker for potential acid reflux  Depression Managed with amitriptyline  and ramelteon . No additional antidepressants desired at this time. Discussed current management and patient's preference to avoid additional medications. - Continue amitriptyline  50 mg oral bedtime - Continue ramelteon       Return in about 10 weeks (around 01/19/2025) for recheck/follow-up.    Subjective:    Chief Complaint  Patient presents with   Medical Management of Chronic Issues    Pt in office for follow up labs and next steps in care with diabetes    HPI Discussed the use of AI scribe software for clinical note transcription with the patient, who gave verbal consent to proceed.  History of Present Illness Eric Ortega is a 64 year old male who presents for a follow-up visit to discuss recent blood work.  He has a history of chronically low platelet count, recently noted to be 84, slightly lower than previous levels. His white blood cell count is also slightly low. He has been evaluated by hematology in the past, who attributed the low platelet count to a chronic condition.  Recent blood work showed an elevated alkaline phosphatase level of 151, which has been trending up over the past year or two. He has no known history of liver disease, but his brother has cirrhosis of the liver due to drug and alcohol use. He was hospitalized in January 2022 for kidney dehydration and was on multiple medications at that time, which were subsequently reduced.  His A1c has improved from 12.3 six months ago to 10.5, but it remains elevated. He has been without his Novolog  insulin  for about four weeks due to cost, but continues to take metformin  500 mg twice daily. He has not been on any GLP-1 receptor agonists before. He reports significant proteinuria. He recalls being told two years ago that his kidneys were  dehydrated.  He experienced a gastrointestinal illness three weeks ago, characterized by vomiting for two days and an inability to keep anything down, including Pedialyte. He suspects tomatoes may be a trigger, as he had consumed homemade soup with tomatoes prior to the illness. He has had similar episodes in the past, often associated with consuming tomatoes.  He takes amitriptyline  at bedtime and ramelteon , but no other medications for depression. He reports some seasonal depression during the winter months. He has not had any recent issues with gout, which he previously managed with probenecid and colchicine.     Past Medical History:  Diagnosis Date   Diabetes mellitus without complication (HCC)    Hyperlipidemia    Hypertension     Past Surgical History:  Procedure Laterality Date   APPENDECTOMY      Family History  Problem Relation Age of Onset   Diabetes Mother    Heart failure Mother    Heart attack Mother    Stroke Mother    Heart failure Father    Cirrhosis Brother    Drug abuse Brother    Alcohol abuse Brother  Social History[1]   Allergies[2]  Review of Systems NEGATIVE UNLESS OTHERWISE INDICATED IN HPI      Objective:     BP 136/78 (BP Location: Left Arm, Patient Position: Sitting, Cuff Size: Normal)   Pulse 70   Temp (!) 97.3 F (36.3 C) (Temporal)   Ht 6' (1.829 m)   Wt 197 lb 6.4 oz (89.5 kg)   SpO2 99%   BMI 26.77 kg/m   Wt Readings from Last 3 Encounters:  11/10/24 197 lb 6.4 oz (89.5 kg)  10/19/24 201 lb (91.2 kg)  04/26/24 199 lb (90.3 kg)    BP Readings from Last 3 Encounters:  11/10/24 136/78  10/19/24 (!) 162/84  04/26/24 128/70     Physical Exam Vitals and nursing note reviewed.  Constitutional:      General: He is not in acute distress.    Appearance: Normal appearance. He is not ill-appearing.  HENT:     Head: Normocephalic.     Right Ear: External ear normal.     Left Ear: External ear normal.  Eyes:      Extraocular Movements: Extraocular movements intact.     Conjunctiva/sclera: Conjunctivae normal.     Pupils: Pupils are equal, round, and reactive to light.  Cardiovascular:     Rate and Rhythm: Normal rate and regular rhythm.     Pulses: Normal pulses.     Heart sounds: Normal heart sounds. No murmur heard. Pulmonary:     Effort: Pulmonary effort is normal. No respiratory distress.     Breath sounds: Normal breath sounds. No wheezing.  Musculoskeletal:     Cervical back: Normal range of motion.  Skin:    General: Skin is warm.  Neurological:     Mental Status: He is alert and oriented to person, place, and time.  Psychiatric:        Mood and Affect: Mood normal.        Behavior: Behavior normal.             Rainen Vanrossum M Jerre Diguglielmo, PA-C    [1]  Social History Tobacco Use   Smoking status: Former   Smokeless tobacco: Never  Vaping Use   Vaping status: Never Used  Substance Use Topics   Alcohol use: No   Drug use: No  [2] No Known Allergies  "

## 2024-11-10 NOTE — Patient Instructions (Signed)
" °  VISIT SUMMARY: During your follow-up visit, we discussed your recent blood work and several ongoing health issues, including diabetes, low platelet count, liver enzyme levels, hypertension, nausea, and depression.  YOUR PLAN: TYPE 2 DIABETES MELLITUS WITH DIABETIC NEUROPATHY AND DIABETIC NEPHROPATHY: Your A1c has improved, but it remains elevated. We discussed the potential benefits of a new medication to help manage your diabetes. -Prescribed Ozempic  pending insurance approval. -Continue taking Metformin  500 mg twice daily. -Prescribed losartan  for kidney protection. -Monitor your blood glucose levels regularly. -We may refer you to an endocrinologist for further diabetes management.  CHRONIC IDIOPATHIC THROMBOCYTOPENIA WITH LEUKOPENIA: Your platelet count and white blood cell count are low. We will repeat some tests to further evaluate your condition. -Ordered a repeat CBC. -Ordered a pathologic smear review to assess cell structure.  ABNORMAL LIVER ENZYMES AND ABNORMAL LIVER IMAGING: Your alkaline phosphatase level is elevated, and we need to evaluate your liver function and vitamin D  levels. -Ordered a vitamin D  level test. -Ordered a GGT test. -Ordered a liver ultrasound. -Ordered inflammatory markers (sed rate and CRP).  ESSENTIAL HYPERTENSION: Managing your blood pressure is important for kidney protection. -Prescribed losartan  for kidney protection and blood pressure control.  NAUSEA AND VOMITING: You recently experienced nausea and vomiting, possibly related to tomatoes in your diet. -Prescribed Zofran  for nausea. -Prescribed an acid blocker for potential acid reflux.  DEPRESSION: You are currently managing your depression with amitriptyline  and ramelteon . -Continue taking amitriptyline  50 mg at bedtime. -Continue taking ramelteon .    Contains text generated by [employer].   "

## 2024-11-11 ENCOUNTER — Ambulatory Visit: Payer: Self-pay | Admitting: Physician Assistant

## 2024-11-11 ENCOUNTER — Other Ambulatory Visit: Payer: Self-pay | Admitting: Physician Assistant

## 2024-11-11 ENCOUNTER — Telehealth: Payer: Self-pay

## 2024-11-11 ENCOUNTER — Other Ambulatory Visit: Payer: Self-pay

## 2024-11-11 LAB — PERIPHERAL BLOOD SMEAR REVIEW

## 2024-11-11 MED ORDER — NOVOLOG 70/30 FLEXPEN RELION (70-30) 100 UNIT/ML ~~LOC~~ SUPN: PEN_INJECTOR | SUBCUTANEOUS | 11 refills | Status: AC

## 2024-11-11 NOTE — Telephone Encounter (Signed)
 Previous insulin  sent into pt pharmacy per pt request due to cost.

## 2024-11-11 NOTE — Telephone Encounter (Signed)
 Noted and agreed, thank you.

## 2024-11-11 NOTE — Telephone Encounter (Signed)
 Copied from CRM [id number]. Topic: Clinical - Prescription Issue >> Nov 11, 2024 10:29 AM Vena HERO wrote: Reason for CRM: Pt called in to let Alyssa know that his insurance will not cover Ozempic  and oop cost around $2400. He has been without insulin  for 5-6 weeks and would like to know what alternative she can provide or if she can send in what he was using before. Please call to advise  [facility] pharmacy since pricing sounded incorrect, per pharmacy associate pt had no insurance on file. After giving pharmacy patient insurance info cost is now $581.00 for 70 day supply. Also states since beginning of the year deductible is likely playing a part in that cost. After notifying pt he states this price is still out of range for him at this time and previous insulin  was costing a little over 100.00 for 5 pens. Patient asking to have insulin  sent to pharmacy since he hasn't had it 5-6 weeks.
